# Patient Record
Sex: Male | Born: 1965 | Race: White | Hispanic: No | Marital: Married | Smoking: Never smoker
Health system: Southern US, Community
[De-identification: ages and names within clinical notes are randomized; demographics above are authoritative.]

## PROBLEM LIST (undated history)

## (undated) DIAGNOSIS — E7800 Pure hypercholesterolemia, unspecified: Secondary | ICD-10-CM

## (undated) DIAGNOSIS — E119 Type 2 diabetes mellitus without complications: Secondary | ICD-10-CM

## (undated) DIAGNOSIS — I10 Essential (primary) hypertension: Secondary | ICD-10-CM

## (undated) DIAGNOSIS — M50123 Cervical disc disorder at C6-C7 level with radiculopathy: Secondary | ICD-10-CM

## (undated) DIAGNOSIS — R29898 Other symptoms and signs involving the musculoskeletal system: Secondary | ICD-10-CM

## (undated) DIAGNOSIS — A770 Spotted fever due to Rickettsia rickettsii: Secondary | ICD-10-CM

## (undated) HISTORY — PX: CRANIOTOMY: SHX93

## (undated) HISTORY — PX: TONSILLECTOMY: SUR1361

## (undated) HISTORY — DX: Cervical disc disorder at C6-C7 level with radiculopathy: M50.123

---

## 2002-04-19 ENCOUNTER — Encounter: Payer: Self-pay | Admitting: Family Medicine

## 2002-04-19 ENCOUNTER — Ambulatory Visit (HOSPITAL_COMMUNITY): Admission: RE | Admit: 2002-04-19 | Discharge: 2002-04-19 | Payer: Self-pay | Admitting: Family Medicine

## 2003-03-27 ENCOUNTER — Ambulatory Visit (HOSPITAL_COMMUNITY): Admission: RE | Admit: 2003-03-27 | Discharge: 2003-03-27 | Payer: Self-pay | Admitting: Family Medicine

## 2003-03-27 ENCOUNTER — Encounter: Payer: Self-pay | Admitting: Family Medicine

## 2005-01-26 ENCOUNTER — Observation Stay (HOSPITAL_COMMUNITY): Admission: RE | Admit: 2005-01-26 | Discharge: 2005-01-27 | Payer: Self-pay | Admitting: Orthopedic Surgery

## 2005-01-26 ENCOUNTER — Encounter (INDEPENDENT_AMBULATORY_CARE_PROVIDER_SITE_OTHER): Payer: Self-pay | Admitting: *Deleted

## 2005-10-13 ENCOUNTER — Encounter: Admission: RE | Admit: 2005-10-13 | Discharge: 2005-10-13 | Payer: Self-pay | Admitting: Orthopedic Surgery

## 2007-07-14 ENCOUNTER — Emergency Department (HOSPITAL_COMMUNITY): Admission: EM | Admit: 2007-07-14 | Discharge: 2007-07-14 | Payer: Self-pay | Admitting: Family Medicine

## 2007-08-03 ENCOUNTER — Emergency Department (HOSPITAL_COMMUNITY): Admission: EM | Admit: 2007-08-03 | Discharge: 2007-08-03 | Payer: Self-pay | Admitting: Family Medicine

## 2011-01-01 NOTE — Op Note (Signed)
NAME:  Derrick Duncan, Derrick Duncan NO.:  [id number]   MEDICAL RECORD NO.:  [id number]          PATIENT TYPE:  AMB   LOCATION:  DAY                          FACILITY:  [facility]   PHYSICIAN:  Marlowe Kays, M.D.  DATE OF BIRTH:  04-Nov-1965   DATE OF PROCEDURE:  01/26/2005  DATE OF DISCHARGE:                                 OPERATIVE REPORT   PREOPERATIVE DIAGNOSES:  1.  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  2.  Tear peroneus longus tendon, right ankle and foot.   POSTOPERATIVE DIAGNOSES:  1.  No tarsal coalition noted.  2.  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   OPERATION:  1.  Exploration of right foot for suspected tarsal coalition using C-arm.  2.  Exploration of peroneal tendon right ankle and foot with release tendon      sheath and biopsy of chronic synovitis.   SURGEON:  Marlowe Kays, M.D.   ASSISTANTDruscilla Brownie. Cherlynn June.   ANESTHESIA:  General.   PATHOLOGY AND JUSTIFICATION FOR PROCEDURE:  He has had pain and swelling in  the anterior and lateral right ankle dating from July of 2005. He works out  regularly on the treadmill. He appeared to have a peroneal tendonitis and I  sent him for an MRI which was performed on May 03, 2004. This  demonstrated what appeared to be a longitudinal split tear in the peroneus  longus tendon just distal to the fibular tip as well as an atypical tarsal  coalition involving the cuboid and the navicular. He was originally  scheduled for surgery last November but because of family illness this had  to be postponed. Because his ankle continued to be symptomatic, it was not  felt that any new MRI was needed and he is here today for exploration of the  right foot and takedown of tarsal coalition if found and also repair of the  tear in the peroneus longus tendon. See operative description below for  additional details of the surgery.   DESCRIPTION OF PROCEDURE:   Satisfactory general anesthesia, pneumatic  tourniquet, leg was esmarched out nonsteriley and prepped from mid calf to  toes with DuraPrep, draped in a sterile field. I first used a C-arm to  localize the area of the cuboid navicular suspected coalition. I was then  able to incorporate this area along with the incision I would need for  exploration of the perineal tendon then do a curvilinear incision. I used  the C-arm repetitively to locate the cuboid navicular area. This involved  elevating up the extensor brevis muscle which was protected. Under direct  visualization, I was able to see the calcaneocuboid joint which looked  normal. I then carried it superiorly to the calcaneotalar and cuboid  navicular joints. Under direct visualization, I was able to see that there  was some fibrous tissue there but there was motion at all these joints  passively and it did not appear that there was anything in the way of  coalition that needed to be excised. The position of exposure was documented  by using  the C-arm. I then dismissed x-ray and went to the peroneus longus  tendon. The sheath itself was thickened and appeared to be somewhat abnormal  appearing. I opened it with a 15 knife blade and a large amount of fluid  came forward indicating chronic injury. This was at the level just distal to  the fibular tip as noted on the MRI. He had a lot of reactive synovitis  present which I debrided out and sent to pathology. I then explored the  tendon from well proximal to the tip of the fibula to well distal almost  down to the metatarsal area where it entered down underneath the foot. I did  find a 2 cm area of healed __________ in the tendon which would appear to  have been the area that was noted on the MRI. Mechanically it appeared to me  what had happened was that he had chronic peroneal tendonitis due to  impingement of the very thickened sheath with injury to the peroneal tendon  which had healed  itself but the mechanical impingement problem was ongoing.  Therefore after excising all the synovium, I left the very thickened tendon  sheath open, the __________ was stable but no longer had any impingement on  it. I irrigated the wound with sterile saline with sterile saline. I  reattached the lateral portion of the extensor brevis musculature from its  origin. We closed the subcutaneous tissue with interrupted 3-0 Vicryl, the  skin with a combination of 4-0 and 3-0 nylon mattress sutures. Betadine  Adaptic dry sterile dressing with a well padded short leg splint cast was  applied. The tourniquet was released. He tolerated the procedure well and  was taken to the recovery room in satisfactory condition with no known  complications.       JA/MEDQ  D:  01/26/2005  T:  01/26/2005  Job:  [id number]

## 2011-08-25 ENCOUNTER — Other Ambulatory Visit: Payer: Self-pay | Admitting: Orthopedic Surgery

## 2011-08-25 DIAGNOSIS — M25552 Pain in left hip: Secondary | ICD-10-CM

## 2011-08-28 ENCOUNTER — Ambulatory Visit
Admission: RE | Admit: 2011-08-28 | Discharge: 2011-08-28 | Disposition: A | Discharge status: Home / Self Care | Payer: Self-pay | Source: Ambulatory Visit | Attending: Orthopedic Surgery | Admitting: Orthopedic Surgery

## 2011-08-28 DIAGNOSIS — M25552 Pain in left hip: Secondary | ICD-10-CM

## 2011-09-02 ENCOUNTER — Other Ambulatory Visit: Payer: Self-pay | Admitting: Orthopedic Surgery

## 2011-09-02 DIAGNOSIS — M25552 Pain in left hip: Secondary | ICD-10-CM

## 2011-09-08 ENCOUNTER — Other Ambulatory Visit: Payer: BC Managed Care – PPO

## 2011-09-15 ENCOUNTER — Ambulatory Visit
Admission: RE | Admit: 2011-09-15 | Discharge: 2011-09-15 | Disposition: A | Discharge status: Home / Self Care | Payer: BC Managed Care – PPO | Source: Ambulatory Visit | Attending: Orthopedic Surgery | Admitting: Orthopedic Surgery

## 2011-09-15 DIAGNOSIS — M25552 Pain in left hip: Secondary | ICD-10-CM

## 2012-03-04 ENCOUNTER — Emergency Department (HOSPITAL_COMMUNITY)
Admission: EM | Admit: 2012-03-04 | Discharge: 2012-03-04 | Disposition: A | Discharge status: Home / Self Care | Payer: BC Managed Care – PPO | Attending: Emergency Medicine | Admitting: Emergency Medicine

## 2012-03-04 ENCOUNTER — Encounter (HOSPITAL_COMMUNITY): Payer: Self-pay | Admitting: Emergency Medicine

## 2012-03-04 DIAGNOSIS — T148XXA Other injury of unspecified body region, initial encounter: Secondary | ICD-10-CM

## 2012-03-04 DIAGNOSIS — Y998 Other external cause status: Secondary | ICD-10-CM | POA: Insufficient documentation

## 2012-03-04 DIAGNOSIS — L03119 Cellulitis of unspecified part of limb: Secondary | ICD-10-CM

## 2012-03-04 DIAGNOSIS — E785 Hyperlipidemia, unspecified: Secondary | ICD-10-CM | POA: Insufficient documentation

## 2012-03-04 DIAGNOSIS — I10 Essential (primary) hypertension: Secondary | ICD-10-CM | POA: Insufficient documentation

## 2012-03-04 DIAGNOSIS — E7800 Pure hypercholesterolemia, unspecified: Secondary | ICD-10-CM | POA: Insufficient documentation

## 2012-03-04 DIAGNOSIS — L02619 Cutaneous abscess of unspecified foot: Secondary | ICD-10-CM | POA: Insufficient documentation

## 2012-03-04 DIAGNOSIS — W268XXA Contact with other sharp object(s), not elsewhere classified, initial encounter: Secondary | ICD-10-CM | POA: Insufficient documentation

## 2012-03-04 DIAGNOSIS — Y9239 Other specified sports and athletic area as the place of occurrence of the external cause: Secondary | ICD-10-CM | POA: Insufficient documentation

## 2012-03-04 DIAGNOSIS — Y9301 Activity, walking, marching and hiking: Secondary | ICD-10-CM | POA: Insufficient documentation

## 2012-03-04 DIAGNOSIS — S91309A Unspecified open wound, unspecified foot, initial encounter: Secondary | ICD-10-CM | POA: Insufficient documentation

## 2012-03-04 HISTORY — DX: Pure hypercholesterolemia, unspecified: E78.00

## 2012-03-04 HISTORY — DX: Essential (primary) hypertension: I10

## 2012-03-04 MED ORDER — HYDROCODONE-ACETAMINOPHEN 5-325 MG PO TABS
2.0000 | ORAL_TABLET | Freq: Once | ORAL | Status: AC
  Administered 2012-03-04: 2 via ORAL
  Filled 2012-03-04: qty 2

## 2012-03-04 MED ORDER — AMOXICILLIN-POT CLAVULANATE 875-125 MG PO TABS
1.0000 | ORAL_TABLET | Freq: Once | ORAL | Status: AC
  Administered 2012-03-04: 1 via ORAL
  Filled 2012-03-04: qty 1

## 2012-03-04 MED ORDER — HYDROCODONE-ACETAMINOPHEN 5-325 MG PO TABS: 1.0000 | ORAL_TABLET | ORAL | Status: AC | PRN

## 2012-03-04 MED ORDER — TETANUS-DIPHTH-ACELL PERTUSSIS 5-2.5-18.5 LF-MCG/0.5 IM SUSP
0.5000 mL | Freq: Once | INTRAMUSCULAR | Status: AC
  Administered 2012-03-04: 0.5 mL via INTRAMUSCULAR
  Filled 2012-03-04: qty 0.5

## 2012-03-04 MED ORDER — AMOXICILLIN-POT CLAVULANATE 875-125 MG PO TABS: 1.0000 | ORAL_TABLET | Freq: Two times a day (BID) | ORAL | Status: AC

## 2012-03-04 NOTE — ED Provider Notes (Signed)
Medical screening examination/treatment/procedure(s) were performed by non-physician practitioner and as supervising physician I was immediately available for consultation/collaboration.   Loren Racer, MD 03/04/12 [phone]

## 2012-03-04 NOTE — ED Provider Notes (Signed)
History     CSN: [id number]  Arrival date & time 03/04/12  2105   First MD Initiated Contact with Patient 03/04/12 2123      Chief Complaint  Patient presents with  . Foot Injury   HPI  History provided by the patient and wife. Patient is a 46 year old male with history of hypertension hypercholesterolemia who presents with foot injury and pain. Patient [place] and walking barefoot in a pond. Patient stepped on something in the water which appear to be a stick or some similar-type material. This punctured into his foot. His wife who is an Char[employer] fundraiser attempted to remove this material and believes that most other was removed. Since that time patient has had continued swelling and pain to the foot area. There is slight redness. They deny having any bleeding or drainage from the area. Patient denies any other aggravating or alleviating factors. He denies any other associated symptoms. Denies any erythematous streaks, fever, chills or sweats.    Past Medical History  Diagnosis Date  . Hypertension   . Hypercholesteremia     Past Surgical History  Procedure Date  . Craniotomy     History reviewed. No pertinent family history.  History  Substance Use Topics  . Smoking status: Never Smoker   . Smokeless tobacco: Not on file  . Alcohol Use: Yes     Light Use      Review of Systems  Constitutional: Negative for fever and chills.  Musculoskeletal:       Pain and swelling to the right foot and toes  Neurological: Negative for weakness and numbness.    Allergies  Review of patient's allergies indicates no known allergies.  Home Medications   Current Outpatient Rx  Name Route Sig Dispense Refill  . LISINOPRIL 40 MG PO TABS Oral Take 40 mg by mouth daily.    Marland Kitchen SIMVASTATIN 40 MG PO TABS Oral Take 40 mg by mouth every evening.      BP 123/80  Pulse 90  Temp 98.3 F (36.8 C) (Oral)  Resp 18  SpO2 98%  Physical Exam  Nursing note and vitals  reviewed. Constitutional: He is oriented to person, place, and time. He appears well-developed and well-nourished. No distress.  HENT:  Head: Normocephalic.  Cardiovascular: Normal rate and regular rhythm.   Pulmonary/Chest: Effort normal and breath sounds normal.  Musculoskeletal:       Diffuse swelling to the anterior pad of the right foot with erythema at the base of the right third toe. There is also small puncture type wound to the skin.  Normal sensation to the toes. Normal cap refill. Normal dorsal pedal pulses. No erythematous streaks or calf tenderness.  Neurological: He is alert and oriented to person, place, and time.  Skin: Skin is warm.  Psychiatric: He has a normal mood and affect. His behavior is normal.    ED Course  Procedures        1. Cellulitis of foot   2. Puncture wound       MDM  9:45PM patient seen and evaluated. There is swelling and slight erythema especially to the base of the third toe. Will obtain x-rays to look for any retained foreign bodies. There is clinical concern for infection and will certainly plan to start patient on antibiotics per  After further consideration patient and wife do not wish to have x-rays at this time. They wish to just followup with orthopedic specialist outpatient. Will give one dose  of Augmentin here in the emergency room and prescription for the same. Patient also given prescription for Hebert S[place], [place] 03/04/12 2207

## 2012-03-04 NOTE — ED Notes (Signed)
Patient complaining of right foot injury; states that he was out working in the yard/field yesterday when he stepped on a stick/unknown substance while in pond water.  Patient's family member tried to remove most of the stick/substance from right foot; states that the redness, swelling and pain is worse today.  Last tetanus has been within the last 10 years, but not in the last 5 years.

## 2013-01-05 ENCOUNTER — Emergency Department (HOSPITAL_COMMUNITY)
Admission: EM | Admit: 2013-01-05 | Discharge: 2013-01-05 | Disposition: A | Discharge status: Home / Self Care | Payer: BC Managed Care – PPO | Attending: Emergency Medicine | Admitting: Emergency Medicine

## 2013-01-05 ENCOUNTER — Emergency Department (HOSPITAL_COMMUNITY): Payer: BC Managed Care – PPO

## 2013-01-05 ENCOUNTER — Encounter (HOSPITAL_COMMUNITY): Payer: Self-pay | Admitting: Emergency Medicine

## 2013-01-05 DIAGNOSIS — R509 Fever, unspecified: Secondary | ICD-10-CM | POA: Insufficient documentation

## 2013-01-05 DIAGNOSIS — R748 Abnormal levels of other serum enzymes: Secondary | ICD-10-CM | POA: Insufficient documentation

## 2013-01-05 DIAGNOSIS — Z79899 Other long term (current) drug therapy: Secondary | ICD-10-CM | POA: Insufficient documentation

## 2013-01-05 DIAGNOSIS — E7800 Pure hypercholesterolemia, unspecified: Secondary | ICD-10-CM | POA: Insufficient documentation

## 2013-01-05 DIAGNOSIS — R42 Dizziness and giddiness: Secondary | ICD-10-CM | POA: Insufficient documentation

## 2013-01-05 DIAGNOSIS — R51 Headache: Secondary | ICD-10-CM | POA: Insufficient documentation

## 2013-01-05 DIAGNOSIS — R112 Nausea with vomiting, unspecified: Secondary | ICD-10-CM | POA: Insufficient documentation

## 2013-01-05 DIAGNOSIS — I10 Essential (primary) hypertension: Secondary | ICD-10-CM | POA: Insufficient documentation

## 2013-01-05 LAB — PROTEIN AND GLUCOSE, CSF
Glucose, CSF: 69 mg/dL (ref 43–76)
Total  Protein, CSF: 50 mg/dL — ABNORMAL HIGH (ref 15–45)

## 2013-01-05 LAB — COMPREHENSIVE METABOLIC PANEL
AST: 239 U/L — ABNORMAL HIGH (ref 0–37)
Calcium: 9.4 mg/dL (ref 8.4–10.5)
Creatinine, Ser: 0.93 mg/dL (ref 0.50–1.35)
GFR calc non Af Amer: >90 mL/min (ref >90)
Glucose, Bld: 113 mg/dL — ABNORMAL HIGH (ref 70–99)
Potassium: 3.9 mEq/L (ref 3.5–5.1)
Sodium: 136 mEq/L (ref 135–145)
Total Bilirubin: 2 mg/dL — ABNORMAL HIGH (ref 0.3–1.2)
Total Protein: 7 g/dL (ref 6.0–8.3)

## 2013-01-05 LAB — CBC WITH DIFFERENTIAL/PLATELET
Eosinophils Absolute: 0 10*3/uL (ref 0.0–0.7)
Eosinophils Relative: 0 % (ref 0–5)
HCT: 38.6 % — ABNORMAL LOW (ref 39.0–52.0)
Lymphs Abs: 0.4 10*3/uL — ABNORMAL LOW (ref 0.7–4.0)
MCH: 31 pg (ref 26.0–34.0)
MCHC: 36 g/dL (ref 30.0–36.0)
MCV: 86.2 fL (ref 78.0–100.0)
RDW: 12.9 % (ref 11.5–15.5)

## 2013-01-05 LAB — GRAM STAIN

## 2013-01-05 LAB — CSF CELL COUNT WITH DIFFERENTIAL: WBC, CSF: 1 /mm3 (ref 0–5)

## 2013-01-05 MED ORDER — OXYCODONE-ACETAMINOPHEN 5-325 MG PO TABS
2.0000 | ORAL_TABLET | Freq: Once | ORAL | Status: AC
  Administered 2013-01-05: 2 via ORAL
  Filled 2013-01-05: qty 2

## 2013-01-05 MED ORDER — ONDANSETRON HCL 4 MG/2ML IJ SOLN
4.0000 mg | Freq: Once | INTRAMUSCULAR | Status: AC
  Administered 2013-01-05: 4 mg via INTRAVENOUS
  Filled 2013-01-05: qty 2

## 2013-01-05 MED ORDER — OXYCODONE-ACETAMINOPHEN 5-325 MG PO TABS: 1.0000 | ORAL_TABLET | Freq: Four times a day (QID) | ORAL | Status: DC | PRN

## 2013-01-05 MED ORDER — ONDANSETRON 8 MG PO TBDP: 8.0000 mg | ORAL_TABLET | Freq: Three times a day (TID) | ORAL | Status: DC | PRN

## 2013-01-05 MED ORDER — SODIUM CHLORIDE 0.9 % IV BOLUS (SEPSIS)
1000.0000 mL | Freq: Once | INTRAVENOUS | Status: AC
  Administered 2013-01-05: 1000 mL via INTRAVENOUS

## 2013-01-05 MED ORDER — MORPHINE SULFATE 4 MG/ML IJ SOLN
6.0000 mg | Freq: Once | INTRAMUSCULAR | Status: AC
  Administered 2013-01-05: 4 mg via INTRAVENOUS
  Filled 2013-01-05: qty 1

## 2013-01-05 MED ORDER — MORPHINE SULFATE 4 MG/ML IJ SOLN
6.0000 mg | Freq: Once | INTRAMUSCULAR | Status: AC
  Administered 2013-01-05: 6 mg via INTRAVENOUS
  Filled 2013-01-05: qty 2

## 2013-01-05 NOTE — ED Provider Notes (Signed)
History     CSN: [id number]  Arrival date & time 01/05/13  1039   First MD Initiated Contact with Patient 01/05/13 1058      Chief Complaint  Patient presents with  . Fever    (Consider location/radiation/quality/duration/timing/severity/associated sxs/prior treatment) HPI Comments: Patient presents with complaint of fever and headache for the past 3 days. Patient saw his primary care physician 2 days ago and started on Tamiflu which was then changed to doxycycline yesterday. Concern for a tickborne illness given patient is in the woods a lot and has removed multiple ticks on him over the past few months. Headache is generalized and does not radiate into the neck. It is worse with position but not with movement. It is associated with photophobia. Patient has been having difficulty keeping down his medications do to vomiting. She denies chest pain, cough, shortness of breath, abdominal pain, diarrhea. Fever has been as high as 102F. Motrin brings it back down to normal temporarily. Onset of symptoms gradual. Course is persistent.   Patient is a 47 y.o. male presenting with fever. The history is provided by the patient and the spouse.  Fever Associated symptoms: headaches, nausea and vomiting   Associated symptoms: no chest pain, no confusion, no congestion, no cough, no diarrhea, no dysuria, no myalgias, no rash, no rhinorrhea and no sore throat     Past Medical History  Diagnosis Date  . Hypertension   . Hypercholesteremia     Past Surgical History  Procedure Laterality Date  . Craniotomy      History reviewed. No pertinent family history.  History  Substance Use Topics  . Smoking status: Never Smoker   . Smokeless tobacco: Not on file  . Alcohol Use: Yes     Comment: Light Use      Review of Systems  Constitutional: Positive for fever.  HENT: Negative for congestion, sore throat, rhinorrhea, neck pain, neck stiffness, dental problem and sinus pressure.   Eyes:  Negative for photophobia, discharge, redness and visual disturbance.  Respiratory: Negative for cough and shortness of breath.   Cardiovascular: Negative for chest pain.  Gastrointestinal: Positive for nausea and vomiting. Negative for abdominal pain and diarrhea.  Genitourinary: Negative for dysuria.  Musculoskeletal: Negative for myalgias and gait problem.  Skin: Negative for rash.  Neurological: Positive for dizziness and headaches. Negative for syncope, speech difficulty, weakness and numbness.  Psychiatric/Behavioral: Negative for confusion.    Allergies  Review of patient's allergies indicates no known allergies.  Home Medications   Current Outpatient Rx  Name  Route  Sig  Dispense  Refill  . doxycycline (DORYX) 100 MG DR capsule   Oral   Take 100 mg by mouth 2 (two) times daily.         Marland Kitchen HYDROcodone-acetaminophen (NORCO/VICODIN) 5-325 MG per tablet   Oral   Take 1-2 tablets by mouth every 6 (six) hours as needed for pain.         Marland Kitchen ibuprofen (ADVIL,MOTRIN) 600 MG tablet   Oral   Take 600 mg by mouth every 6 (six) hours as needed for fever.         Marland Kitchen lisinopril (PRINIVIL,ZESTRIL) 40 MG tablet   Oral   Take 40 mg by mouth daily.         . Multiple Vitamin (MULTIVITAMIN WITH MINERALS) TABS   Oral   Take 1 tablet by mouth daily.         . simvastatin (ZOCOR) 40 MG tablet   Oral  Take 40 mg by mouth every morning.            BP 121/79  Pulse 94  Temp(Src) 98.5 F (36.9 C) (Oral)  Resp 20  SpO2 99%  Physical Exam  Nursing note and vitals reviewed. Constitutional: He is oriented to person, place, and time. He appears well-developed and well-nourished.  HENT:  Head: Normocephalic and atraumatic.  Right Ear: Tympanic membrane, external ear and ear canal normal.  Left Ear: Tympanic membrane, external ear and ear canal normal.  Nose: Nose normal.  Mouth/Throat: Uvula is midline, oropharynx is clear and moist and mucous membranes are normal.  Eyes:  Conjunctivae, EOM and lids are normal. Pupils are equal, round, and reactive to light. Right eye exhibits no discharge. Left eye exhibits no discharge.  Neck: Normal range of motion. Neck supple.  No meningeal signs  Cardiovascular: Normal rate, regular rhythm and normal heart sounds.   Pulmonary/Chest: Effort normal and breath sounds normal.  Abdominal: Soft. There is no tenderness.  Musculoskeletal: Normal range of motion.       Cervical back: He exhibits normal range of motion, no tenderness and no bony tenderness.  Neurological: He is alert and oriented to person, place, and time. He has normal strength and normal reflexes. No cranial nerve deficit or sensory deficit. He exhibits normal muscle tone. He displays a negative Romberg sign. Coordination and gait normal. GCS eye subscore is 4. GCS verbal subscore is 5. GCS motor subscore is 6.  Skin: Skin is warm and dry.  Psychiatric: He has a normal mood and affect.    ED Course  LUMBAR PUNCTURE Date/Time: 01/05/2013 2:30 PM Performed by: Renne Crigler Authorized by: Renne Crigler Consent: Verbal consent obtained. written consent obtained. Risks and benefits: risks, benefits and alternatives were discussed Consent given by: patient Patient understanding: patient states understanding of the procedure being performed Patient consent: the patient's understanding of the procedure matches consent given Procedure consent: procedure consent matches procedure scheduled Relevant documents: relevant documents present and verified Test results: test results available and properly labeled Site marked: the operative site was marked Patient identity confirmed: verbally with patient Time out: Immediately prior to procedure a "time out" was called to verify the correct patient, procedure, equipment, support staff and site/side marked as required. Indications: evaluation for infection Anesthesia: local infiltration Local anesthetic: lidocaine 2%  without epinephrine Anesthetic total: 10 ml Patient sedated: no Preparation: Patient was prepped and draped in the usual sterile fashion. Lumbar space: L3-L4 interspace Patient's position: left lateral decubitus Needle gauge: 20 Needle type: spinal needle - Quincke tip Number of attempts: 1 Fluid appearance: blood-tinged then clearing Tubes of fluid: 4 Total volume: 4 ml Post-procedure: site cleaned and adhesive bandage applied Patient tolerance: Patient tolerated the procedure well with no immediate complications. Comments: Tech Justin at bedside to assist   (including critical care time)  Labs Reviewed  CBC WITH DIFFERENTIAL - Abnormal; Notable for the following:    WBC 2.7 (*)    HCT 38.6 (*)    Platelets 84 (*)    Lymphs Abs 0.4 (*)    All other components within normal limits  COMPREHENSIVE METABOLIC PANEL - Abnormal; Notable for the following:    Glucose, Bld 113 (*)    Albumin 3.4 (*)    AST 239 (*)    ALT 279 (*)    Alkaline Phosphatase 212 (*)    Total Bilirubin 2.0 (*)    All other components within normal limits  CSF CELL COUNT WITH  DIFFERENTIAL - Abnormal; Notable for the following:    Color, CSF PINK (*)    Appearance, CSF HAZY (*)    RBC Count, CSF 805 (*)    All other components within normal limits  PROTEIN AND GLUCOSE, CSF - Abnormal; Notable for the following:    Total  Protein, CSF 50 (*)    All other components within normal limits  GRAM STAIN  CSF CULTURE  CSF CELL COUNT WITH DIFFERENTIAL  URINALYSIS, ROUTINE W REFLEX MICROSCOPIC  ROCKY MTN SPOTTED FVR AB, IGG-BLOOD  ROCKY MTN SPOTTED FVR AB, IGM-BLOOD   Ct Head Wo Contrast  01/05/2013   *RADIOLOGY REPORT*  Clinical Data: Headache, fever  CT HEAD WITHOUT CONTRAST  Technique:  Contiguous axial images were obtained from the base of the skull through the vertex without contrast.  Comparison: None.  Findings: No acute intracranial hemorrhage, acute infarction, mass lesion, mass effect, midline shift  or hydrocephalus.  Gray-white differentiation is preserved throughout.  No acute soft tissue abnormality.  The globes and orbits are symmetric and unremarkable bilaterally.  Normal aeration of the mastoid air cells and paranasal sinuses.  No acute calvarial abnormality.  IMPRESSION: Negative noncontrasted CT scan of the head.   Original Report Authenticated By: Malachy Moan, M.D.     1. Fever   2. Headache   3. Elevated liver enzymes     11:26 AM Patient seen and examined. Work-up initiated. Medications ordered.   Vital signs reviewed and are as follows: Filed Vitals:   01/05/13 1052  BP: 121/79  Pulse: 94  Temp: 98.5 F (36.9 C)  Resp: 20   Patient d/w and seen by Dr. Bernette Mayers.   CT head reviewed by myself and is negative.   Patient agrees to go forth with LP to evaluate for meningitis.   2:20 PM LP successful, performed per procedure note. Was traumatic but cleared. Additional pain medication ordered. Awaiting results.   3:22 PM Recheck, patient continues to lie flat. He is comfortable. Awaiting CSF results.   4:37 PM Discussed all results with patient and his wife.   Plan: switch pain medication from vicodin to percocet as vicodin is not working.  Follow-up with PCP next week for recheck. F/u on RSMF titre. Recheck liver enzymes after acute phase of illness is complete.  Zofran written for nausea, he is to continue doxycycline to cover for tick-borne illness.  Post LP instructions given: lie flat, drink caffeine, hydrate well.   Return with worsening HA, persistent vomiting, new or changing symptoms.   Patient and wife verbalize understanding and agree with plan.    MDM  Fever of unknown origin. Vital etiology, tick-borne illness is possible. Concern for meningitis here given severe HA and fever history. LP was performed and CSF does not indicate infection. WBC, liver enzymes will need to be followed as outpatient. Patient appears non-toxic and has reliable PCP f/u.  Covered for tick-borne illness. Return instructions given.         Renne Crigler, PA-C 01/05/13 [phone]

## 2013-01-05 NOTE — ED Notes (Signed)
Csf sample colected by Josh-PA

## 2013-01-05 NOTE — ED Notes (Signed)
Assumed patient care from leaving RN. Introduced myself to patient and updated on his care.

## 2013-01-05 NOTE — ED Notes (Signed)
Pt sts HA with fever onset last Tuesday!  Pt sts had "30" ticks removed since march.  Pt caox4, nad. C/o HA, N/V, light headed dizzy.  Last meal was Tuesday.

## 2013-01-08 LAB — CSF CULTURE W GRAM STAIN: Culture: NO GROWTH

## 2013-01-08 NOTE — ED Provider Notes (Signed)
Medical screening examination/treatment/procedure(s) were conducted as a shared visit with non-physician practitioner(s) and myself.  I personally evaluated the patient during the encounter   Pt with fever, headache, general malaise for several days. Many tick bites, started on Doxy by PCP but vomiting and unable to keep it down. LP done by [place], [facility] reviewed.   Charles B. Bernette Mayers, MD 01/08/13 1301

## 2013-01-09 LAB — ROCKY MTN SPOTTED FVR AB, IGM-BLOOD: RMSF IgM: 0.27 IV (ref 0.00–0.89)

## 2013-01-10 ENCOUNTER — Telehealth (HOSPITAL_COMMUNITY): Payer: Self-pay | Admitting: Emergency Medicine

## 2013-01-10 NOTE — ED Notes (Signed)
Pt calling for [facility] Spotted Fever results.  Pt gave permission to speak to his wife about results.  Pt's wife informed RMSF IgG & IgM results were negative and CSF cx was final w/no growth after 3 days

## 2015-02-09 ENCOUNTER — Encounter (HOSPITAL_COMMUNITY): Payer: Self-pay | Admitting: Emergency Medicine

## 2015-02-09 ENCOUNTER — Emergency Department (HOSPITAL_COMMUNITY)
Admission: EM | Admit: 2015-02-09 | Discharge: 2015-02-09 | Disposition: A | Discharge status: Home / Self Care | Payer: BC Managed Care – PPO | Attending: Emergency Medicine | Admitting: Emergency Medicine

## 2015-02-09 DIAGNOSIS — Z79899 Other long term (current) drug therapy: Secondary | ICD-10-CM | POA: Insufficient documentation

## 2015-02-09 DIAGNOSIS — I10 Essential (primary) hypertension: Secondary | ICD-10-CM | POA: Insufficient documentation

## 2015-02-09 DIAGNOSIS — R739 Hyperglycemia, unspecified: Secondary | ICD-10-CM | POA: Diagnosis present

## 2015-02-09 DIAGNOSIS — Z8619 Personal history of other infectious and parasitic diseases: Secondary | ICD-10-CM | POA: Insufficient documentation

## 2015-02-09 HISTORY — DX: Spotted fever due to Rickettsia rickettsii: A77.0

## 2015-02-09 LAB — URINALYSIS, ROUTINE W REFLEX MICROSCOPIC
BILIRUBIN URINE: NEGATIVE
HGB URINE DIPSTICK: NEGATIVE
Leukocytes, UA: NEGATIVE
Nitrite: NEGATIVE
PH: 5.5 (ref 5.0–8.0)
PROTEIN: NEGATIVE mg/dL
Specific Gravity, Urine: 1.015 (ref 1.005–1.030)
Urobilinogen, UA: 0.2 mg/dL (ref 0.0–1.0)

## 2015-02-09 LAB — CBC WITH DIFFERENTIAL/PLATELET
BASOS ABS: 0 10*3/uL (ref 0.0–0.1)
Basophils Relative: 0 % (ref 0–1)
Eosinophils Absolute: 0.1 10*3/uL (ref 0.0–0.7)
Eosinophils Relative: 1 % (ref 0–5)
HCT: 42.7 % (ref 39.0–52.0)
HEMOGLOBIN: 15.3 g/dL (ref 13.0–17.0)
Lymphocytes Relative: 29 % (ref 12–46)
Lymphs Abs: 2.5 10*3/uL (ref 0.7–4.0)
MCH: 31.3 pg (ref 26.0–34.0)
MCHC: 35.8 g/dL (ref 30.0–36.0)
MCV: 87.3 fL (ref 78.0–100.0)
MONO ABS: 0.8 10*3/uL (ref 0.1–1.0)
Monocytes Relative: 9 % (ref 3–12)
Neutro Abs: 5.3 10*3/uL (ref 1.7–7.7)
Neutrophils Relative %: 61 % (ref 43–77)
Platelets: 268 10*3/uL (ref 150–400)
RBC: 4.89 MIL/uL (ref 4.22–5.81)
RDW: 12.3 % (ref 11.5–15.5)
WBC: 8.7 10*3/uL (ref 4.0–10.5)

## 2015-02-09 LAB — URINE MICROSCOPIC-ADD ON

## 2015-02-09 LAB — COMPREHENSIVE METABOLIC PANEL
ALT: 39 U/L (ref 17–63)
ANION GAP: 11 (ref 5–15)
AST: 27 U/L (ref 15–41)
Albumin: 4.7 g/dL (ref 3.5–5.0)
Alkaline Phosphatase: 120 U/L (ref 38–126)
BILIRUBIN TOTAL: 1.4 mg/dL — AB (ref 0.3–1.2)
BUN: 29 mg/dL — ABNORMAL HIGH (ref 6–20)
CALCIUM: 9.8 mg/dL (ref 8.9–10.3)
CO2: 26 mmol/L (ref 22–32)
Chloride: 89 mmol/L — ABNORMAL LOW (ref 101–111)
Creatinine, Ser: 1.22 mg/dL (ref 0.61–1.24)
GFR calc non Af Amer: >60 mL/min (ref >60)
Glucose, Bld: 525 mg/dL — ABNORMAL HIGH (ref 65–99)
Potassium: 4.5 mmol/L (ref 3.5–5.1)
Sodium: 126 mmol/L — ABNORMAL LOW (ref 135–145)
TOTAL PROTEIN: 7.8 g/dL (ref 6.5–8.1)

## 2015-02-09 LAB — CBG MONITORING, ED
Glucose-Capillary: 550 mg/dL — ABNORMAL HIGH (ref 65–99)
Glucose-Capillary: 294 mg/dL — ABNORMAL HIGH (ref 65–99)

## 2015-02-09 MED ORDER — IBUPROFEN 800 MG PO TABS
ORAL_TABLET | ORAL | Status: AC
  Filled 2015-02-09: qty 1

## 2015-02-09 MED ORDER — SODIUM CHLORIDE 0.9 % IV BOLUS (SEPSIS)
1000.0000 mL | Freq: Once | INTRAVENOUS | Status: AC
  Administered 2015-02-09: 1000 mL via INTRAVENOUS

## 2015-02-09 MED ORDER — IBUPROFEN 800 MG PO TABS
800.0000 mg | ORAL_TABLET | Freq: Once | ORAL | Status: AC
  Administered 2015-02-09: 800 mg via ORAL

## 2015-02-09 MED ORDER — METFORMIN HCL 500 MG PO TABS
500.0000 mg | ORAL_TABLET | Freq: Once | ORAL | Status: AC
  Administered 2015-02-09: 500 mg via ORAL
  Filled 2015-02-09: qty 1

## 2015-02-09 MED ORDER — METFORMIN HCL 1000 MG PO TABS: 500.0000 mg | ORAL_TABLET | Freq: Two times a day (BID) | ORAL | Status: DC

## 2015-02-09 NOTE — ED Notes (Signed)
Patient reports increase in thirst and urination, blurred vision, cramping in hands, and fatigue. Per wife patient's blood sugar checked-345. Patient does not have hx of diabetes. Blood sugar 550 in triage. Per wife patient started vomiting last night. No active vomiting noted at this time.

## 2015-02-09 NOTE — ED Notes (Signed)
MD at bedside. 

## 2015-02-09 NOTE — ED Provider Notes (Signed)
TIME SEEN: 2:20 PM  CHIEF COMPLAINT: Hyperglycemia  HPI: Pt is a 49 y.o. male with history of hypertension, hyperlipidemia who presents emergency department with several days of polydipsia, polyuria, headaches, blurry vision, numbness and tingling in his fingers and toes, fatigue. Wife reports she checked his blood sugar at home and was 345. In the ED is 550. States he did have nausea vomiting last night but none currently. No abdominal pain. No history of previous diabetes. Reports his father does have type 2 diabetes.  ROS: See HPI Constitutional: no fever  Eyes: no drainage  ENT: no runny nose   Cardiovascular:  no chest pain  Resp: no SOB  GI: no vomiting GU: no dysuria Integumentary: no rash  Allergy: no hives  Musculoskeletal: no leg swelling  Neurological: no slurred speech ROS otherwise negative  PAST MEDICAL HISTORY/PAST SURGICAL HISTORY:  Past Medical History  Diagnosis Date  . Hypertension   . Hypercholesteremia   . [place] spotted fever     MEDICATIONS:  Prior to Admission medications   Medication Sig Start Date End Date Taking? Authorizing Provider  lisinopril (PRINIVIL,ZESTRIL) 40 MG tablet Take 40 mg by mouth daily.   Yes Historical Provider, MD  simvastatin (ZOCOR) 40 MG tablet Take 40 mg by mouth every morning.    Yes Historical Provider, MD  ondansetron (ZOFRAN ODT) 8 MG disintegrating tablet Take 1 tablet (8 mg total) by mouth every 8 (eight) hours as needed for nausea. Patient not taking: Reported on 02/09/2015 01/05/13   Renne Crigler, PA-C  oxyCODONE-acetaminophen (PERCOCET/ROXICET) 5-325 MG per tablet Take 1-2 tablets by mouth every 6 (six) hours as needed for pain. Patient not taking: Reported on 02/09/2015 01/05/13   Renne Crigler, PA-C    ALLERGIES:  No Known Allergies  SOCIAL HISTORY:  History  Substance Use Topics  . Smoking status: Never Smoker   . Smokeless tobacco: Current User    Types: Chew  . Alcohol Use: Yes     Comment: Light Use     FAMILY HISTORY: Family History  Problem Relation Age of Onset  . Diabetes Father     EXAM: BP 112/74 mmHg  Pulse 86  Temp(Src) 98 F (36.7 C) (Oral)  Resp 16  Ht 6' (1.829 m)  Wt 244 lb (110.678 kg)  BMI 33.09 kg/m2  SpO2 97% CONSTITUTIONAL: Alert and oriented and responds appropriately to questions. Well-appearing; well-nourished, afebrile, pleasant, in no distress HEAD: Normocephalic EYES: Conjunctivae clear, PERRL ENT: normal nose; no rhinorrhea; slightly dry mucous membranes; pharynx without lesions noted NECK: Supple, no meningismus, no LAD  CARD: RRR; S1 and S2 appreciated; no murmurs, no clicks, no rubs, no gallops RESP: Normal chest excursion without splinting or tachypnea; breath sounds clear and equal bilaterally; no wheezes, no rhonchi, no rales, no hypoxia or respiratory distress, speaking full sentences ABD/GI: Normal bowel sounds; non-distended; soft, non-tender, no rebound, no guarding, no peritoneal signs BACK:  The back appears normal and is non-tender to palpation, there is no CVA tenderness EXT: Normal ROM in all joints; non-tender to palpation; no edema; normal capillary refill; no cyanosis, no calf tenderness or swelling    SKIN: Normal color for age and race; warm NEURO: Moves all extremities equally, sensation to light touch intact diffusely, cranial nerves II through XII intact PSYCH: The patient's mood and manner are appropriate. Grooming and personal hygiene are appropriate.  MEDICAL DECISION MAKING: Patient here with hyperglycemia. Hemodynamically stable. Exam benign. We'll obtain labs, urine. Will treat with IV hydration.  ED PROGRESS:  Patient's labs show hyponatremia with a sodium of 126 likely from his hyperglycemia. Corrected sodium is 133. Bicarbonate is 26, anion gap 11. Urine does show trace ketones and glucosuria. Patient is not in DKA. We'll recheck CBG after 2 L of IV fluids.   Patient's blood glucose has improved 294 after 2 L of IV  fluids. We'll start him on metformin 500 mg. Discussed with him at length increasing exercise and changing his diet. Have also discussed monitoring his blood glucose at home in the morning and after meals and keeping a log of this. Have discussed close follow-up with his primary care provider and return precautions. Patient and wife verbalize understanding and are comfortable with plan.     Layla Maw Juventino Pavone, DO 02/09/15 1606

## 2015-02-09 NOTE — Discharge Instructions (Signed)
Hyperglycemia °Hyperglycemia occurs when the glucose (sugar) in your blood is too high. Hyperglycemia can happen for many reasons, but it most often happens to people who do not know they have diabetes or are not managing their diabetes properly.  °CAUSES  °Whether you have diabetes or not, there are other causes of hyperglycemia. Hyperglycemia can occur when you have diabetes, but it can also occur in other situations that you might not be as aware of, such as: °Diabetes °· If you have diabetes and are having problems controlling your blood glucose, hyperglycemia could occur because of some of the following reasons: °¨ Not following your meal plan. °¨ Not taking your diabetes medications or not taking it properly. °¨ Exercising less or doing less activity than you normally do. °¨ Being sick. °Pre-diabetes °· This cannot be ignored. Before people develop Type 2 diabetes, they almost always have "pre-diabetes." This is when your blood glucose levels are higher than normal, but not yet high enough to be diagnosed as diabetes. Research has shown that some long-term damage to the body, especially the heart and circulatory system, may already be occurring during pre-diabetes. If you take action to manage your blood glucose when you have pre-diabetes, you may delay or prevent Type 2 diabetes from developing. °Stress °· If you have diabetes, you may be "diet" controlled or on oral medications or insulin to control your diabetes. However, you may find that your blood glucose is higher than usual in the hospital whether you have diabetes or not. This is often referred to as "stress hyperglycemia." Stress can elevate your blood glucose. This happens because of hormones put out by the body during times of stress. If stress has been the cause of your high blood glucose, it can be followed regularly by your caregiver. That way he/she can make sure your hyperglycemia does not continue to get worse or progress to  diabetes. °Steroids °· Steroids are medications that act on the infection fighting system (immune system) to block inflammation or infection. One side effect can be a rise in blood glucose. Most people can produce enough extra insulin to allow for this rise, but for those who cannot, steroids make blood glucose levels go even higher. It is not unusual for steroid treatments to "uncover" diabetes that is developing. It is not always possible to determine if the hyperglycemia will go away after the steroids are stopped. A special blood test called an A1c is sometimes done to determine if your blood glucose was elevated before the steroids were started. °SYMPTOMS °· Thirsty. °· Frequent urination. °· Dry mouth. °· Blurred vision. °· Tired or fatigue. °· Weakness. °· Sleepy. °· Tingling in feet or leg. °DIAGNOSIS  °Diagnosis is made by monitoring blood glucose in one or all of the following ways: °· A1c test. This is a chemical found in your blood. °· Fingerstick blood glucose monitoring. °· Laboratory results. °TREATMENT  °First, knowing the cause of the hyperglycemia is important before the hyperglycemia can be treated. Treatment may include, but is not be limited to: °· Education. °· Change or adjustment in medications. °· Change or adjustment in meal plan. °· Treatment for an illness, infection, etc. °· More frequent blood glucose monitoring. °· Change in exercise plan. °· Decreasing or stopping steroids. °· Lifestyle changes. °HOME CARE INSTRUCTIONS  °· Test your blood glucose as directed. °· Exercise regularly. Your caregiver will give you instructions about exercise. Pre-diabetes or diabetes which comes on with stress is helped by exercising. °· Eat wholesome,   balanced meals. Eat often and at regular, fixed times. Your caregiver or nutritionist will give you a meal plan to guide your sugar intake. °· Being at an ideal weight is important. If needed, losing as little as 10 to 15 pounds may help improve blood  glucose levels. °SEEK MEDICAL CARE IF:  °· You have questions about medicine, activity, or diet. °· You continue to have symptoms (problems such as increased thirst, urination, or weight gain). °SEEK IMMEDIATE MEDICAL CARE IF:  °· You are vomiting or have diarrhea. °· Your breath smells fruity. °· You are breathing faster or slower. °· You are very sleepy or incoherent. °· You have numbness, tingling, or pain in your feet or hands. °· You have chest pain. °· Your symptoms get worse even though you have been following your caregiver's orders. °· If you have any other questions or concerns. °Document Released: 01/26/2001 Document Revised: 10/25/2011 Document Reviewed: 11/29/2011 °ExitCare® Patient Information ©2015 [employer]. This information is not intended to replace advice given to you by your health care provider. Make sure you discuss any questions you have with your health care provider. ° °Blood Glucose Monitoring °Monitoring your blood glucose (also know as blood sugar) helps you to manage your diabetes. It also helps you and your health care provider monitor your diabetes and determine how well your treatment plan is working. °WHY SHOULD YOU MONITOR YOUR BLOOD GLUCOSE? °· It can help you understand how food, exercise, and medicine affect your blood glucose. °· It allows you to know what your blood glucose is at any given moment. You can quickly tell if you are having low blood glucose (hypoglycemia) or high blood glucose (hyperglycemia). °· It can help you and your health care provider know how to adjust your medicines. °· It can help you understand how to manage an illness or adjust medicine for exercise. °WHEN SHOULD YOU TEST? °Your health care provider will help you decide how often you should check your blood glucose. This may depend on the type of diabetes you have, your diabetes control, or the types of medicines you are taking. Be sure to write down all of your blood glucose readings so that this  information can be reviewed with your health care provider. See below for examples of testing times that your health care provider may suggest. °Type 1 Diabetes °· Test 4 times a day if you are in good control, using an insulin pump, or perform multiple daily injections. °· If your diabetes is not well controlled or if you are sick, you may need to monitor more often. °· It is a good idea to also monitor: °¨ Before and after exercise. °¨ Between meals and 2 hours after a meal. °¨ Occasionally between 2:00 a.m. and 3:00 a.m. °Type 2 Diabetes °· It can vary with each person, but generally, if you are on insulin, test 4 times a day. °· If you take medicines by mouth (orally), test 2 times a day. °· If you are on a controlled diet, test once a day. °· If your diabetes is not well controlled or if you are sick, you may need to monitor more often. °HOW TO MONITOR YOUR BLOOD GLUCOSE °Supplies Needed °· Blood glucose meter. °· Test strips for your meter. Each meter has its own strips. You must use the strips that go with your own meter. °· A pricking needle (lancet). °· A device that holds the lancet (lancing device). °· A journal or log book to write down your   results. °Procedure °· Wash your hands with soap and water. Alcohol is not preferred. °· Prick the side of your finger (not the tip) with the lancet. °· Gently milk the finger until a small drop of blood appears. °· Follow the instructions that come with your meter for inserting the test strip, applying blood to the strip, and using your blood glucose meter. °Other Areas to Get Blood for Testing °Some meters allow you to use other areas of your body (other than your finger) to test your blood. These areas are called alternative sites. The most common alternative sites are: °· The forearm. °· The thigh. °· The back area of the lower leg. °· The palm of the hand. °The blood flow in these areas is slower. Therefore, the blood glucose values you get may be delayed, and  the numbers are different from what you would get from your fingers. Do not use alternative sites if you think you are having hypoglycemia. Your reading will not be accurate. Always use a finger if you are having hypoglycemia. Also, if you cannot feel your lows (hypoglycemia unawareness), always use your fingers for your blood glucose checks. °ADDITIONAL TIPS FOR GLUCOSE MONITORING °· Do not reuse lancets. °· Always carry your supplies with you. °· All blood glucose meters have a 24-hour "hotline" number to call if you have questions or need help. °· Adjust (calibrate) your blood glucose meter with a control solution after finishing a few boxes of strips. °BLOOD GLUCOSE RECORD KEEPING °It is a good idea to keep a daily record or log of your blood glucose readings. Most glucose meters, if not all, keep your glucose records stored in the meter. Some meters come with the ability to download your records to your home computer. Keeping a record of your blood glucose readings is especially helpful if you are wanting to look for patterns. Make notes to go along with the blood glucose readings because you might forget what happened at that exact time. Keeping good records helps you and your health care provider to work together to achieve good diabetes management.  °Document Released: 08/05/2003 Document Revised: 12/17/2013 Document Reviewed: 12/25/2012 °ExitCare® Patient Information ©2015 [employer]. This information is not intended to replace advice given to you by your health care provider. Make sure you discuss any questions you have with your health care provider. ° °

## 2015-02-18 ENCOUNTER — Ambulatory Visit: Payer: BC Managed Care – PPO | Admitting: *Deleted

## 2015-02-20 ENCOUNTER — Encounter: Payer: Self-pay | Admitting: *Deleted

## 2015-02-20 ENCOUNTER — Encounter: Payer: BC Managed Care – PPO | Attending: Pulmonary Disease | Admitting: *Deleted

## 2015-02-20 VITALS — BP 100/62 | Ht 72.0 in | Wt 251.1 lb

## 2015-02-20 DIAGNOSIS — E119 Type 2 diabetes mellitus without complications: Secondary | ICD-10-CM | POA: Diagnosis not present

## 2015-02-20 NOTE — Progress Notes (Signed)
Diabetes Self-Management Education  Visit Type: First/Initial  Appt. Start Time: 0905 Appt. End Time: 1015  02/20/2015  Mr. Derrick Duncan, identified by name and date of birth, is a 49 y.o. male with a diagnosis of Diabetes: Type 2.    ASSESSMENT Blood pressure 100/62, height 6' (1.829 m), weight 251 lb 1.6 oz (113.898 kg). Body mass index is 34.05 kg/(m^2).  Initial Visit Information: Are you currently following a meal plan?: Yes What type of meal plan do you follow?: lean meats, more vegetables, less white foods Are you taking your medications as prescribed?: Yes Are you checking your feet?: No How often do you need to have someone help you when you read instructions, pamphlets, or other written materials from your doctor or pharmacy?: 1 - Never What is the last grade level you completed in school?: Masters  Psychosocial: Patient Belief/Attitude about Diabetes: Motivated to manage diabetes ("it is discouraging") Self-care barriers: None Self-management support: Family, Friends Patient Concerns: Nutrition/Meal planning, Medication, Healthy Lifestyle, Glycemic Control, Weight Control Special Needs: None Preferred Learning Style: Auditory Learning Readiness: Change in progress  Complications:  Last HgB A1C per patient/outside source: 12.2 % (02/18/15 as reported by pt) How often do you check your blood sugar?: 1 time/day Fasting Blood glucose range (mg/dL): [phone], [phone] (Pt reports FBG's 171-180's mg/dL) Have you had a dilated eye exam in the past 12 months?: No Have you had a dental exam in the past 12 months?: Yes  Diet Intake: Breakfast: [place] sausage, bacon, ham, eggs, milk Snack (morning): nuts or cheese Lunch: chicken, cabbage, brocolli Snack (afternoon): nuts or cheese Dinner: meat and vegetables Beverage(s): water, unsweetened tea with Splenda, 1 diet soda  Exercise: Exercise: ADL's  Individualized Plan for Diabetes Self-Management Training:  Learning  Objective:  Patient will have a greater understanding of diabetes self-management.  Education Topics Reviewed with Patient Today: Definition of diabetes, type 1 and 2, and the diagnosis of diabetes, Factors that contribute to the development of diabetes, Explored patient's options for treatment of their diabetes Role of diet in the treatment of diabetes and the relationship between the three main macronutrients and blood glucose level, Food label reading, portion sizes and measuring food. Role of exercise on diabetes management, blood pressure control and cardiac health. Reviewed patients medication for diabetes, action, purpose, timing of dose and side effects. Purpose and frequency of SMBG., Identified appropriate SMBG and/or A1C goals. Relationship between chronic complications and blood glucose control, Retinopathy and reason for yearly dilated eye exams Identified and addressed patients feelings and concerns about diabetes  PATIENTS GOALS/Plan (Developed by the patient): Improve blood sugars Decrease medications Lose weight Lead a healthier lifestyle  Plan:   Patient Instructions  Check blood sugars 1 x day before breakfast or 2 hrs after supper every day Exercise: Begin walking for 15  minutes  3 days a week and gradually increase to 30 minutes 5 x week Eat 3 meals day,  1-2  snacks a day Space meals 4-6 hours apart Make a eye doctor appointment when blood sugars are stable Bring blood sugar records to class  Expected Outcomes:  Demonstrated interest in learning. Expect positive outcomes  Education material provided:  General Meal Planning Guidelines  If problems or questions, patient to contact team via:  Sharion SettlerSheila Shotwell, RN, CCM, CDE [phone]  Future DSME appointment:  March 24, 2015 for Class 1

## 2015-02-20 NOTE — Patient Instructions (Addendum)
Check blood sugars 1 x day before breakfast and 2 hrs after supper every day Exercise: Begin walking for 15  minutes  3 days a week and gradually increase to 30 minutes 5 x week Eat 3 meals day,  1-2  snacks a day Space meals 4-6 hours apart Make a eye doctor appointment when blood sugars are stable Bring blood sugar records to class

## 2015-03-24 ENCOUNTER — Encounter: Payer: BC Managed Care – PPO | Attending: Pulmonary Disease | Admitting: Dietician

## 2015-03-24 VITALS — Ht 72.0 in | Wt 258.3 lb

## 2015-03-24 DIAGNOSIS — E119 Type 2 diabetes mellitus without complications: Secondary | ICD-10-CM | POA: Insufficient documentation

## 2015-03-24 NOTE — Progress Notes (Signed)

## 2015-03-31 ENCOUNTER — Encounter: Payer: Self-pay | Admitting: Dietician

## 2015-03-31 ENCOUNTER — Encounter: Payer: BC Managed Care – PPO | Admitting: Dietician

## 2015-03-31 VITALS — Wt 249.5 lb

## 2015-03-31 DIAGNOSIS — E119 Type 2 diabetes mellitus without complications: Secondary | ICD-10-CM | POA: Diagnosis present

## 2015-03-31 NOTE — Progress Notes (Signed)

## 2015-04-07 ENCOUNTER — Encounter: Payer: BC Managed Care – PPO | Admitting: Dietician

## 2015-04-07 VITALS — BP 108/80 | Ht 72.0 in | Wt 251.0 lb

## 2015-04-07 DIAGNOSIS — E119 Type 2 diabetes mellitus without complications: Secondary | ICD-10-CM

## 2015-04-07 NOTE — Progress Notes (Signed)

## 2015-04-18 ENCOUNTER — Encounter: Payer: Self-pay | Admitting: *Deleted

## 2015-11-18 ENCOUNTER — Emergency Department (HOSPITAL_COMMUNITY)
Admission: EM | Admit: 2015-11-18 | Discharge: 2015-11-18 | Disposition: A | Discharge status: Home / Self Care | Payer: BC Managed Care – PPO | Attending: Emergency Medicine | Admitting: Emergency Medicine

## 2015-11-18 ENCOUNTER — Encounter (HOSPITAL_COMMUNITY): Payer: Self-pay

## 2015-11-18 ENCOUNTER — Other Ambulatory Visit: Payer: Self-pay

## 2015-11-18 ENCOUNTER — Emergency Department (HOSPITAL_COMMUNITY): Payer: BC Managed Care – PPO

## 2015-11-18 DIAGNOSIS — R0789 Other chest pain: Secondary | ICD-10-CM | POA: Diagnosis not present

## 2015-11-18 DIAGNOSIS — E7800 Pure hypercholesterolemia, unspecified: Secondary | ICD-10-CM | POA: Insufficient documentation

## 2015-11-18 DIAGNOSIS — E785 Hyperlipidemia, unspecified: Secondary | ICD-10-CM | POA: Insufficient documentation

## 2015-11-18 DIAGNOSIS — E119 Type 2 diabetes mellitus without complications: Secondary | ICD-10-CM | POA: Insufficient documentation

## 2015-11-18 DIAGNOSIS — Z79899 Other long term (current) drug therapy: Secondary | ICD-10-CM | POA: Diagnosis not present

## 2015-11-18 DIAGNOSIS — Z7984 Long term (current) use of oral hypoglycemic drugs: Secondary | ICD-10-CM | POA: Diagnosis not present

## 2015-11-18 DIAGNOSIS — I10 Essential (primary) hypertension: Secondary | ICD-10-CM | POA: Diagnosis not present

## 2015-11-18 DIAGNOSIS — Z8619 Personal history of other infectious and parasitic diseases: Secondary | ICD-10-CM | POA: Diagnosis not present

## 2015-11-18 DIAGNOSIS — Z7982 Long term (current) use of aspirin: Secondary | ICD-10-CM | POA: Insufficient documentation

## 2015-11-18 DIAGNOSIS — R079 Chest pain, unspecified: Secondary | ICD-10-CM | POA: Diagnosis present

## 2015-11-18 HISTORY — DX: Type 2 diabetes mellitus without complications: E11.9

## 2015-11-18 LAB — BASIC METABOLIC PANEL
ANION GAP: 7 (ref 5–15)
BUN: 13 mg/dL (ref 6–20)
CHLORIDE: 108 mmol/L (ref 101–111)
CO2: 28 mmol/L (ref 22–32)
Calcium: 10 mg/dL (ref 8.9–10.3)
Creatinine, Ser: 0.91 mg/dL (ref 0.61–1.24)
GFR calc Af Amer: >60 mL/min (ref >60)
Glucose, Bld: 98 mg/dL (ref 65–99)
POTASSIUM: 4.1 mmol/L (ref 3.5–5.1)
SODIUM: 143 mmol/L (ref 135–145)

## 2015-11-18 LAB — CBC
HEMATOCRIT: 39.8 % (ref 39.0–52.0)
HEMOGLOBIN: 13.3 g/dL (ref 13.0–17.0)
MCH: 30 pg (ref 26.0–34.0)
MCHC: 33.4 g/dL (ref 30.0–36.0)
MCV: 89.6 fL (ref 78.0–100.0)
Platelets: 253 10*3/uL (ref 150–400)
RBC: 4.44 MIL/uL (ref 4.22–5.81)
RDW: 12.4 % (ref 11.5–15.5)
WBC: 7.3 10*3/uL (ref 4.0–10.5)

## 2015-11-18 LAB — I-STAT TROPONIN, ED
TROPONIN I, POC: 0 ng/mL (ref 0.00–0.08)
Troponin i, poc: 0 ng/mL (ref 0.00–0.08)

## 2015-11-18 MED ORDER — NITROGLYCERIN 0.4 MG SL SUBL
0.4000 mg | SUBLINGUAL_TABLET | SUBLINGUAL | Status: AC | PRN
  Administered 2015-11-18 (×3): 0.4 mg via SUBLINGUAL
  Filled 2015-11-18: qty 1

## 2015-11-18 NOTE — ED Notes (Addendum)
Pt reports onset several days chest pain substernal, describes as tightness and constant.  Pain has not worsened.  Pt thinks he pulled a muscle.  No other s/s noted. NO pain relief with Tums.

## 2015-11-18 NOTE — ED Provider Notes (Signed)
CSN: [id number]     Arrival date & time 11/18/15  1317 History   First MD Initiated Contact with Patient 11/18/15 1651     Chief Complaint  Patient presents with  . Chest Pain     HPI  50 y.o. male with history of hypertension, hyperlipidemia, insulin-dependent diabetes, who presents with 3 days of substernal chest tightness. Pain is intermittent. No known aggravating or alleviating factors. Nonexertional and nonpleuritic. Denies radiation. No shortness of breath. Patient denies cough, fevers, nausea, vomiting, diaphoresis. No cardiac history. Pain is not worse with palpation, and is currently 3 out of 10 in severity.   Past Medical History  Diagnosis Date  . Hypertension   . Hypercholesteremia   . [facility]   . Diabetes mellitus without complication [facility](HCC)    Past Surgical History  Procedure Laterality Date  . Craniotomy      subdural hematoma    Family History  Problem Relation Age of Onset  . Diabetes Father    Social History  Substance Use Topics  . Smoking status: Never Smoker   . Smokeless tobacco: Current User    Types: Chew  . Alcohol Use: 0.6 - 1.2 oz/week    1-2 Standard drinks or equivalent per week     Comment: Light Use    Review of Systems  Constitutional: Negative for fever, chills, activity change and appetite change.  HENT: Negative for congestion, facial swelling, rhinorrhea and sore throat.   Eyes: Negative for visual disturbance.  Respiratory: Negative for cough, shortness of breath and wheezing.   Cardiovascular: Positive for chest pain. Negative for palpitations and leg swelling.  Gastrointestinal: Negative for nausea, vomiting, abdominal pain, diarrhea, constipation and blood in stool.  Genitourinary: Negative for dysuria, frequency, hematuria, flank pain and difficulty urinating.  Musculoskeletal: Negative for myalgias, back pain, joint swelling, arthralgias, neck pain and neck stiffness.  Skin: Negative for rash.  Neurological:  Negative for dizziness, weakness, light-headedness and headaches.  Psychiatric/Behavioral: Negative for behavioral problems, confusion and agitation.      Allergies  Review of patient's allergies indicates no known allergies.  Home Medications   Prior to Admission medications   Medication Sig Start Date End Date Taking? Authorizing Provider  aspirin 81 MG tablet Take 81 mg by mouth daily.   Yes Historical Provider, MD  lisinopril (PRINIVIL,ZESTRIL) 40 MG tablet Take 40 mg by mouth daily.   Yes Historical Provider, MD  metFORMIN (GLUCOPHAGE) 1000 MG tablet Take 0.5 tablets (500 mg total) by mouth 2 (two) times daily. 02/09/15  Yes [name], DO  simvastatin (ZOCOR) 40 MG tablet Take 40 mg by mouth every morning.    Yes Historical Provider, MD   BP 123/93 mmHg  Pulse 73  Temp(Src) 97.8 F (36.6 C) (Oral)  Resp 14  Ht 6' (1.829 m)  Wt 118.207 kg  BMI 35.34 kg/m2  SpO2 96% Physical Exam  Constitutional: He is oriented to person, place, and time. He appears well-developed and well-nourished. No distress.  HENT:  Head: Normocephalic and atraumatic.  Right Ear: External ear normal.  Left Ear: External ear normal.  Nose: Nose normal.  Mouth/Throat: Oropharynx is clear and moist. No oropharyngeal exudate.  Eyes: Conjunctivae are normal. Pupils are equal, round, and reactive to light. Right eye exhibits no discharge. Left eye exhibits no discharge. No scleral icterus.  Neck: Normal range of motion. Neck supple. No tracheal deviation present.  Cardiovascular: Normal rate, regular rhythm, normal heart sounds and intact distal pulses.  Exam  reveals no gallop and no friction rub.   No murmur heard. Pulmonary/Chest: Effort normal and breath sounds normal. No respiratory distress. He has no wheezes. He has no rales. He exhibits no tenderness.  Abdominal: Soft. Bowel sounds are normal. He exhibits no distension and no mass. There is no tenderness. There is no rebound and no guarding.   Musculoskeletal: Normal range of motion. He exhibits no edema or tenderness.  Neurological: He is alert and oriented to person, place, and time. He exhibits normal muscle tone.  Skin: Skin is warm and dry. No rash noted. He is not diaphoretic.  Psychiatric: He has a normal mood and affect. His behavior is normal. Judgment and thought content normal.    ED Course  Procedures (including critical care time) Labs Review Labs Reviewed  BASIC METABOLIC PANEL  CBC  I-STAT TROPOININ, ED  Rosezena Sensor, ED    Imaging Review Dg Chest 2 View  11/18/2015  CLINICAL DATA:  Chest pain for several days EXAM: CHEST  2 VIEW COMPARISON:  None. FINDINGS: Cardiomediastinal silhouette is unremarkable. No acute infiltrate or pleural effusion. No pulmonary edema. Mild degenerative changes lower thoracic spine. Mild degenerative changes lumbar spine. IMPRESSION: No active cardiopulmonary disease. Mild degenerative changes lower thoracic and lumbar spine. Electronically Signed   By: Natasha Mead M.D.   On: 11/18/2015 13:53   I have personally reviewed and evaluated these images and lab results as part of my medical decision-making.   EKG Interpretation   Date/Time:  Tuesday November 18 2015 18:34:58 EDT Ventricular Rate:  57 PR Interval:  156 QRS Duration: 96 QT Interval:  460 QTC Calculation: 448 R Axis:   52 Text Interpretation:  Sinus rhythm ST elev, probable normal early repol  pattern no significant change since earlier in the day Confirmed by  GOLDSTON  MD, SCOTT ([id number]) on 11/18/2015 7:19:32 PM      MDM   Final diagnoses:  Other chest pain    Patient is generally well-appearing. No exertional component of chest pain. EKG not concerning for acute ischemic changes. Troponin 0.00 at 0 and 3 hours. Remainder of labs unremarkable. Patient denies shortness of breath and I doubt PE, coronary artery disease, and aortic dissection. No focal consolidations to suggest pneumonia. HEART score of 4. After  extensive discussion with the patient and his wife regarding his risk factors for coronary artery disease, as part of shared decision-making, they would like to follow up with cardiology as an outpatient to obtain a nuclear stress test instead of being admitted to the hospital. I contacted Dr. Jacinto Halim from cardiology, who will arrange a stress test in the coming days for the patient. Patient was given strict precautions to return to the ED immediately for worsening pain with exertional component or shortness of breath. He expressed agreement and understanding with this plan and will follow up as appropriate.     Daralyn Bert Ernestina Penna, MD 11/19/15 4098  Pricilla Loveless, MD 11/19/15 [phone]

## 2015-11-18 NOTE — ED Notes (Signed)
Resident at the bedside

## 2016-11-07 ENCOUNTER — Encounter (HOSPITAL_COMMUNITY): Payer: Self-pay | Admitting: Emergency Medicine

## 2016-11-07 ENCOUNTER — Ambulatory Visit (INDEPENDENT_AMBULATORY_CARE_PROVIDER_SITE_OTHER): Payer: BC Managed Care – PPO

## 2016-11-07 ENCOUNTER — Ambulatory Visit (HOSPITAL_COMMUNITY)
Admission: EM | Admit: 2016-11-07 | Discharge: 2016-11-07 | Disposition: A | Discharge status: Home / Self Care | Payer: BC Managed Care – PPO | Attending: Family Medicine | Admitting: Family Medicine

## 2016-11-07 DIAGNOSIS — R1084 Generalized abdominal pain: Secondary | ICD-10-CM

## 2016-11-07 NOTE — Discharge Instructions (Signed)
There is no evidence of a bowel obstruction. I would use miralax daily for the next several weeks and increase your fluid intake.  If no relief within the next few days, follow up with your primary care provider.

## 2016-11-07 NOTE — ED Triage Notes (Signed)
[facility] a [facility] of [facility] pain and bloating with N/D x 1 week. The patient stated that he just finished a prescription of Tamiflu for influenza.

## 2016-11-07 NOTE — ED Provider Notes (Signed)
CSN: [id number]     Arrival date & time 11/07/16  1217 History   First MD Initiated Contact with Patient 11/07/16 1354     Chief Complaint  Patient presents with  . Abdominal Pain   (Consider location/radiation/quality/duration/timing/severity/associated sxs/prior Treatment)  HPI   The patient is a 51 year old male presenting today with complaints of abdominal pain with mild nausea and small amounts of diarrhea for approximately one week. Patient states he was recently diagnosed with the flu for which he was taking Tamiflu. His appetite has been diminished however he feels as though he is "blocked up".  Patient is concerned because he states the amount of diarrhea he is having is very small compared to the abdominal fullness he is feeling.  Past Medical History:  Diagnosis Date  . Diabetes mellitus without complication (HCC)   . Hypercholesteremia   . Hypertension   . Rocky Mountain spotted fever    Past Surgical History:  Procedure Laterality Date  . CRANIOTOMY     subdural hematoma    Family History  Problem Relation Age of Onset  . Diabetes Father    Social History  Substance Use Topics  . Smoking status: Never Smoker  . Smokeless tobacco: Current User    Types: Chew  . Alcohol use 0.6 - 1.2 oz/week    1 - 2 Standard drinks or equivalent per week     Comment: Light Use    Review of Systems  Constitutional: Negative.  Negative for fatigue and fever.  HENT: Negative.   Eyes: Negative.   Respiratory: Negative.  Negative for cough and shortness of breath.   Cardiovascular: Negative.   Gastrointestinal: Positive for abdominal distention, abdominal pain, constipation, diarrhea and nausea. Negative for anal bleeding, blood in stool and vomiting.  Endocrine: Negative.   Genitourinary: Negative.  Negative for decreased urine volume, difficulty urinating, flank pain, frequency and urgency.  Musculoskeletal: Negative.   Skin: Negative.   Allergic/Immunologic: Negative.    Neurological: Negative.  Negative for headaches.  Hematological: Negative.   Psychiatric/Behavioral: Negative.     Allergies  Patient has no known allergies.  Home Medications   Prior to Admission medications   Medication Sig Start Date End Date Taking? Authorizing Provider  aspirin 81 MG tablet Take 81 mg by mouth daily.   Yes Historical Provider, MD  lisinopril (PRINIVIL,ZESTRIL) 40 MG tablet Take 40 mg by mouth daily.   Yes Historical Provider, MD  metFORMIN (GLUCOPHAGE) 1000 MG tablet Take 0.5 tablets (500 mg total) by mouth 2 (two) times daily. 02/09/15  Yes [name], DO  simvastatin (ZOCOR) 40 MG tablet Take 40 mg by mouth every morning.    Yes Historical Provider, MD   Meds Ordered and Administered this Visit  Medications - No data to display  BP 131/74 (BP Location: Right Arm)   Pulse 88   Temp 98.7 F (37.1 C) (Oral)   Resp 18   SpO2 99%  No data found.   Physical Exam  Constitutional: He is oriented to person, place, and time. He appears well-developed and well-nourished. No distress.  Cardiovascular: Normal rate, regular rhythm, normal heart sounds and intact distal pulses.  Exam reveals no gallop and no friction rub.   No murmur heard. Pulmonary/Chest: Effort normal and breath sounds normal. No respiratory distress. He has no wheezes. He has no rales. He exhibits no tenderness.  Abdominal: Soft. He exhibits no distension and no mass. There is no tenderness. There is no rebound and no guarding. No hernia.  Hypoactive bowel sounds in RU and Bilateral lower quadrants.  Abdomen is rounded and full.  Not able to palpate any firm stool.  Neurological: He is alert and oriented to person, place, and time.  Skin: Skin is warm and dry. He is not diaphoretic.  Nursing note and vitals reviewed.   Urgent Care Course     Procedures (including critical care time)  Labs Review Labs Reviewed - No data to display  Imaging Review Dg Abd 1 View  Result Date:  11/07/2016 CLINICAL DATA:  Abdominal bloating and tenderness EXAM: ABDOMEN - 1 VIEW COMPARISON:  None. FINDINGS: There is moderate stool throughout the colon. There is no bowel dilatation or air-fluid level suggesting bowel obstruction. No free air. There is a small phlebolith in the lower right pelvis. IMPRESSION: No bowel obstruction or free air.  Moderate stool in colon. Electronically Signed   By: Bretta Bang III M.D.   On: 11/07/2016 14:31    Discussed with patient's that there is no evidence of bowel obstruction. Patient to take Miralax and increase water daily for the next several days if no significant improvement or he is still feeling uncomfortable to follow-up with his primary care provider.  MDM   1. Generalized abdominal pain    The usual and customary discharge instructions and warnings were given.  The patient verbalizes understanding and agrees to plan of care.       Servando Salina, NP 11/07/16 [phone]

## 2016-11-11 ENCOUNTER — Emergency Department (HOSPITAL_COMMUNITY)
Admission: EM | Admit: 2016-11-11 | Discharge: 2016-11-11 | Disposition: A | Discharge status: Home / Self Care | Payer: BC Managed Care – PPO | Attending: Emergency Medicine | Admitting: Emergency Medicine

## 2016-11-11 ENCOUNTER — Encounter (HOSPITAL_COMMUNITY): Payer: Self-pay

## 2016-11-11 ENCOUNTER — Emergency Department (HOSPITAL_COMMUNITY): Payer: BC Managed Care – PPO

## 2016-11-11 DIAGNOSIS — I10 Essential (primary) hypertension: Secondary | ICD-10-CM | POA: Insufficient documentation

## 2016-11-11 DIAGNOSIS — Z7982 Long term (current) use of aspirin: Secondary | ICD-10-CM | POA: Diagnosis not present

## 2016-11-11 DIAGNOSIS — M25562 Pain in left knee: Secondary | ICD-10-CM | POA: Insufficient documentation

## 2016-11-11 DIAGNOSIS — E119 Type 2 diabetes mellitus without complications: Secondary | ICD-10-CM | POA: Diagnosis not present

## 2016-11-11 DIAGNOSIS — M25561 Pain in right knee: Secondary | ICD-10-CM | POA: Insufficient documentation

## 2016-11-11 LAB — COMPREHENSIVE METABOLIC PANEL
ALBUMIN: 3.2 g/dL — AB (ref 3.5–5.0)
ALT: 40 U/L (ref 17–63)
ANION GAP: 7 (ref 5–15)
AST: 24 U/L (ref 15–41)
Alkaline Phosphatase: 74 U/L (ref 38–126)
BUN: 7 mg/dL (ref 6–20)
CALCIUM: 9.3 mg/dL (ref 8.9–10.3)
CHLORIDE: 103 mmol/L (ref 101–111)
CO2: 29 mmol/L (ref 22–32)
CREATININE: 0.9 mg/dL (ref 0.61–1.24)
GFR calc non Af Amer: >60 mL/min (ref >60)
Glucose, Bld: 118 mg/dL — ABNORMAL HIGH (ref 65–99)
Potassium: 4.1 mmol/L (ref 3.5–5.1)
SODIUM: 139 mmol/L (ref 135–145)
Total Bilirubin: 1.6 mg/dL — ABNORMAL HIGH (ref 0.3–1.2)
Total Protein: 6.9 g/dL (ref 6.5–8.1)

## 2016-11-11 LAB — URINALYSIS, ROUTINE W REFLEX MICROSCOPIC
Bacteria, UA: NONE SEEN
Bilirubin Urine: NEGATIVE
Glucose, UA: >=500 mg/dL — AB
HGB URINE DIPSTICK: NEGATIVE
Ketones, ur: NEGATIVE mg/dL
Leukocytes, UA: NEGATIVE
NITRITE: NEGATIVE
Protein, ur: 30 mg/dL — AB
SPECIFIC GRAVITY, URINE: 1.026 (ref 1.005–1.030)
Squamous Epithelial / LPF: NONE SEEN
pH: 5 (ref 5.0–8.0)

## 2016-11-11 LAB — SYNOVIAL CELL COUNT + DIFF, W/ CRYSTALS
Crystals, Fluid: NONE SEEN
Lymphocytes-Synovial Fld: 33 % — ABNORMAL HIGH (ref 0–20)
MONOCYTE-MACROPHAGE-SYNOVIAL FLUID: 21 % — AB (ref 50–90)
NEUTROPHIL, SYNOVIAL: 46 % — AB (ref 0–25)
WBC, Synovial: 6400 /mm3 — ABNORMAL HIGH (ref 0–200)

## 2016-11-11 LAB — CBC WITH DIFFERENTIAL/PLATELET
Basophils Absolute: 0 10*3/uL (ref 0.0–0.1)
Basophils Relative: 0 %
EOS ABS: 0.1 10*3/uL (ref 0.0–0.7)
EOS PCT: 1 %
HCT: 36.2 % — ABNORMAL LOW (ref 39.0–52.0)
Hemoglobin: 12 g/dL — ABNORMAL LOW (ref 13.0–17.0)
LYMPHS ABS: 2.1 10*3/uL (ref 0.7–4.0)
Lymphocytes Relative: 15 %
MCH: 29.9 pg (ref 26.0–34.0)
MCHC: 33.1 g/dL (ref 30.0–36.0)
MCV: 90 fL (ref 78.0–100.0)
Monocytes Absolute: 1.5 10*3/uL — ABNORMAL HIGH (ref 0.1–1.0)
Monocytes Relative: 11 %
Neutro Abs: 10.3 10*3/uL — ABNORMAL HIGH (ref 1.7–7.7)
Neutrophils Relative %: 73 %
PLATELETS: 326 10*3/uL (ref 150–400)
RBC: 4.02 MIL/uL — AB (ref 4.22–5.81)
RDW: 13.5 % (ref 11.5–15.5)
WBC: 14.1 10*3/uL — ABNORMAL HIGH (ref 4.0–10.5)

## 2016-11-11 LAB — SEDIMENTATION RATE: SED RATE: 90 mm/h — AB (ref 0–16)

## 2016-11-11 LAB — I-STAT CG4 LACTIC ACID, ED: Lactic Acid, Venous: 0.76 mmol/L (ref 0.5–1.9)

## 2016-11-11 MED ORDER — LIDOCAINE HCL (PF) 1 % IJ SOLN
INTRAMUSCULAR | Status: AC
  Administered 2016-11-11: 2.1 mL
  Filled 2016-11-11: qty 5

## 2016-11-11 MED ORDER — AMOXICILLIN-POT CLAVULANATE 875-125 MG PO TABS: 1.0000 | ORAL_TABLET | Freq: Two times a day (BID) | ORAL | 0 refills | Status: DC

## 2016-11-11 MED ORDER — CEFTRIAXONE SODIUM 1 G IJ SOLR
1.0000 g | Freq: Once | INTRAMUSCULAR | Status: AC
  Administered 2016-11-11: 1 g via INTRAMUSCULAR
  Filled 2016-11-11: qty 10

## 2016-11-11 MED ORDER — LIDOCAINE-EPINEPHRINE (PF) 2 %-1:200000 IJ SOLN
10.0000 mL | Freq: Once | INTRAMUSCULAR | Status: AC
  Administered 2016-11-11: 5 mL
  Filled 2016-11-11: qty 20

## 2016-11-11 NOTE — ED Notes (Signed)
Attempted to ambulate pt in room with HenagarKhatri, [place] -- Pt unable to walk or take full steps.

## 2016-11-11 NOTE — ED Notes (Signed)
Pt has been ill since 3/20 with body aches, nausea, some vomiting.  Had 100.3 oral temp last night.  Some diarrhea.  Two days ago began experiencing bilat knee pain, difficulty walking.  This morning is barely able to stand and shuffle feet.  Saw PMD yesterday without any results.

## 2016-11-11 NOTE — Discharge Instructions (Signed)
Begin taking Augmentin twice daily for 5 days. Alternate ibuprofen and Tylenol to help with knee pain. Rest leg as much as possible. Follow up with primary care doctor as soon as possible. Follow up on blood cultures which will be available in 2 days. Return to ED for any increase in pain, acute injury, increased swelling, change in mental status, chest pain, trouble breathing.

## 2016-11-11 NOTE — ED Notes (Signed)
Unable

## 2016-11-11 NOTE — ED Notes (Signed)
Pt. Wife in hallway asking about patient synovial fluid being sent to lab. RN informed patient that the samples had to be walked down to the lab. Pt. RN notified at this time about her concerns.

## 2016-11-11 NOTE — ED Notes (Signed)
Patient transported to X-ray 

## 2016-11-11 NOTE — ED Provider Notes (Signed)
[facility] DEPT Provider Note   CSN: [id number] Arrival date & time: 11/11/16  6834     History   Chief Complaint Chief Complaint  Patient presents with  . Knee Pain    HPI Derrick Duncan is a 51 y.o. male.  Patient presents with neck pain radiating to shoulders, headache and bilateral knee swelling and pain for the past 3 days.  About 9 days ago, he began having bodyaches, fever, cough and was diagnosed with influenza and treated with Tamiflu per request due to having a daughter with leukemia at home. After about 4 days of treatment, he began having diarrhea which has still not resolved. After that, he began having bilateral knee swelling and a persistent dull headache with associated neck and shoulder pain. Knee pain worsens with weight bearing and sitting up from standing. Also reports fever of 100.42F last night. Neither neck or knee pain has improved with ibuprofen.  Denies chest pain, SOB, palpitations, recent injury, loss of consciousness, AMS.  History is provided by patient and wife. Patient reports medication compliance.      Past Medical History:  Diagnosis Date  . Diabetes mellitus without complication ([place])   . Hypercholesteremia   . Hypertension   . Rocky Mountain spotted fever     Patient Active Problem List   Diagnosis Date Noted  . Hypercholesteremia     Past Surgical History:  Procedure Laterality Date  . CRANIOTOMY     subdural hematoma        Home Medications    Prior to Admission medications   Medication Sig Start Date End Date Taking? Authorizing Provider  aspirin 81 MG tablet Take 81 mg by mouth daily.   Yes Historical Provider, MD  bismuth subsalicylate (PEPTO BISMOL) 262 MG chewable tablet Chew 524 mg by mouth as needed for indigestion.   Yes Historical Provider, MD  ibuprofen (ADVIL,MOTRIN) 200 MG tablet Take 200 mg by mouth every 6 (six) hours as needed for moderate pain.   Yes Historical Provider, MD  lisinopril  (PRINIVIL,ZESTRIL) 40 MG tablet Take 40 mg by mouth daily.   Yes Historical Provider, MD  metFORMIN (GLUCOPHAGE) 1000 MG tablet Take 0.5 tablets (500 mg total) by mouth 2 (two) times daily. Patient taking differently: Take 1,000 mg by mouth 2 (two) times daily.  02/09/15  Yes [name], DO  simvastatin (ZOCOR) 40 MG tablet Take 40 mg by mouth every morning.    Yes Historical Provider, MD  amoxicillin-clavulanate (AUGMENTIN) 875-125 MG tablet Take 1 tablet by mouth every 12 (twelve) hours. 11/11/16   [name], PA    Family History Family History  Problem Relation Age of Onset  . Diabetes Father     Social History Social History  Substance Use Topics  . Smoking status: Never Smoker  . Smokeless tobacco: Current User    Types: Chew  . Alcohol use 0.6 - 1.2 oz/week    1 - 2 Standard drinks or equivalent per week     Comment: Light Use     Allergies   Patient has no known allergies.   Review of Systems Review of Systems  Constitutional: Positive for fever. Negative for appetite change and chills.  HENT: Positive for rhinorrhea. Negative for ear pain, sneezing and sore throat.   Eyes: Negative for photophobia and visual disturbance.  Respiratory: Negative for cough, chest tightness, shortness of breath and wheezing.   Cardiovascular: Negative for chest pain and palpitations.  Gastrointestinal: Positive for diarrhea. Negative for abdominal pain,  blood in stool, constipation, nausea and vomiting.  Endocrine: Negative for polydipsia and polyuria.  Genitourinary: Negative for dysuria, hematuria and urgency.  Musculoskeletal: Positive for arthralgias, joint swelling, neck pain and neck stiffness. Negative for myalgias.  Skin: Negative for rash.  Neurological: Negative for dizziness, weakness and light-headedness.     Physical Exam Updated Vital Signs BP (!) 144/87 (BP Location: Left Arm)   Pulse 94   Temp 99.7 F (37.6 C) (Oral)   Resp 16   Ht 6' (1.829 m)   Wt 113.4  kg   SpO2 97%   BMI 33.91 kg/m   Physical Exam  Constitutional: He is oriented to person, place, and time. He appears well-developed and well-nourished. No distress.  HENT:  Head: Normocephalic and atraumatic.  Nose: Nose normal.  Eyes: Conjunctivae and EOM are normal. Pupils are equal, round, and reactive to light. Right eye exhibits no discharge. Left eye exhibits no discharge. No scleral icterus.  Neck: Normal range of motion. Neck supple. No Brudzinski's sign and no Kernig's sign noted.  Cardiovascular: Normal rate, regular rhythm, normal heart sounds and intact distal pulses.  Exam reveals no gallop and no friction rub.   No murmur heard. Pulmonary/Chest: Effort normal and breath sounds normal. No respiratory distress.  Abdominal: Soft. Bowel sounds are normal. He exhibits no distension. There is no tenderness. There is no guarding.  Musculoskeletal: Normal range of motion. He exhibits edema and tenderness. He exhibits no deformity.       Right knee: He exhibits swelling. He exhibits no ecchymosis and no erythema. Tenderness found.       Left knee: He exhibits swelling. He exhibits no ecchymosis and no deformity. Tenderness found.  Lymphadenopathy:    He has no cervical adenopathy.  Neurological: He is alert and oriented to person, place, and time. He has normal strength. No cranial nerve deficit or sensory deficit. He exhibits normal muscle tone. Coordination normal.  Skin: Skin is warm and dry. No rash noted. No erythema.  Psychiatric: He has a normal mood and affect.  Nursing note and vitals reviewed.    ED Treatments / Results  Labs (all labs ordered are listed, but only abnormal results are displayed) Labs Reviewed  CBC WITH DIFFERENTIAL/PLATELET - Abnormal; Notable for the following:       Result Value   WBC 14.1 (*)    RBC 4.02 (*)    Hemoglobin 12.0 (*)    HCT 36.2 (*)    Neutro Abs 10.3 (*)    Monocytes Absolute 1.5 (*)    All other components within normal  limits  COMPREHENSIVE METABOLIC PANEL - Abnormal; Notable for the following:    Glucose, Bld 118 (*)    Albumin 3.2 (*)    Total Bilirubin 1.6 (*)    All other components within normal limits  URINALYSIS, ROUTINE W REFLEX MICROSCOPIC - Abnormal; Notable for the following:    Glucose, UA >=500 (*)    Protein, ur 30 (*)    All other components within normal limits  SEDIMENTATION RATE - Abnormal; Notable for the following:    Sed Rate 90 (*)    All other components within normal limits  SYNOVIAL CELL COUNT + DIFF, W/ CRYSTALS - Abnormal; Notable for the following:    Appearance-Synovial CLOUDY (*)    WBC, Synovial 6,400 (*)    Neutrophil, Synovial 46 (*)    Lymphocytes-Synovial Fld 33 (*)    Monocyte-Macrophage-Synovial Fluid 21 (*)    All other components within normal limits  BODY FLUID CULTURE  CULTURE, BLOOD (ROUTINE X 2)  CULTURE, BLOOD (ROUTINE X 2)  GLUCOSE, SYNOVIAL FLUID  PROTEIN, SYNOVIAL FLUID  I-STAT CG4 LACTIC ACID, ED    EKG  EKG Interpretation None       Radiology Dg Chest 2 View  Result Date: 11/11/2016 CLINICAL DATA:  Fever EXAM: CHEST  2 VIEW COMPARISON:  11/18/2015 FINDINGS: Cardiac enlargement without heart failure. Lungs are clear without infiltrate effusion or mass. No change from the prior study. Thoracic osteophytes. IMPRESSION: No active cardiopulmonary disease. Electronically Signed   By: Franchot Gallo M.D.   On: 11/11/2016 15:12    Procedures Procedures (including critical care time)  Medications Ordered in ED Medications  cefTRIAXone (ROCEPHIN) injection 1 g (not administered)  lidocaine-EPINEPHrine (XYLOCAINE W/EPI) 2 %-1:200000 (PF) injection 10 mL (5 mLs Infiltration Given 11/11/16 1400)     Initial Impression / Assessment and Plan / ED Course  I have reviewed the triage vital signs and the nursing notes.  Pertinent labs & imaging results that were available during my care of the patient were reviewed by me and considered in my medical  decision making (see chart for details).     Patient's history and symptoms concerning for post viral syndrome vs. bilateral joint infection. CBC showed leukocytosis at 14.1. CMP, lactic acid were unremarkable.  ESR was elevated at 90. Dr. Ashok Cordia did aspiration of the L knee effusion and was able to remove ~52m of fluid. This was sent for fluid analysis and cell counts. Provided some relief for the patient. Cell counts of synovial fluid returned with WBC count of 16k. This could signify inflammation, although the value is too low for consideration of an infected joint. Patient's CXR showed no active cardiopulmonary disease. Unclear etiology of leukocytosis and elevated ESR. Could be due to viral or bacterial process. Blood cultures pending. Patient given Rocephin 1g in ED. Will be sent home with Augmentin x5 days. Advised to alternate ibuprofen and Tylenol for knee pain. Advised to follow up with PCP as soon as possible and f/u results of blood culture in two days. Strict return precautions were given.   Final Clinical Impressions(s) / ED Diagnoses   Final diagnoses:  Acute pain of both knees    New Prescriptions New Prescriptions   AMOXICILLIN-CLAVULANATE (AUGMENTIN) 875-125 MG TABLET    Take 1 tablet by mouth every 12 (twelve) hours.     [place]03/29[place]18 [place] MD 11/16/16 2255

## 2016-11-11 NOTE — ED Triage Notes (Signed)
Per Pt, Pt is coming from home with complaints of bilateral swelling in his knee. Reports having flu-like symptoms about nine days ago and was giving Tamiflu. Pt reports starting to have diarrhea after the medication that has continued. About a week ago, PT had a bilateral swelling in his knees. Neck pain has increased with stiffness and fevers have continued.

## 2016-11-11 NOTE — ED Notes (Signed)
Got patient into a gown on the monitor patient is resting 

## 2016-11-13 ENCOUNTER — Encounter (HOSPITAL_COMMUNITY): Payer: Self-pay | Admitting: *Deleted

## 2016-11-13 ENCOUNTER — Emergency Department (HOSPITAL_COMMUNITY)
Admission: EM | Admit: 2016-11-13 | Discharge: 2016-11-13 | Disposition: A | Discharge status: Home / Self Care | Payer: BC Managed Care – PPO | Source: Home / Self Care

## 2016-11-13 DIAGNOSIS — M7989 Other specified soft tissue disorders: Secondary | ICD-10-CM | POA: Insufficient documentation

## 2016-11-13 DIAGNOSIS — M064 Inflammatory polyarthropathy: Secondary | ICD-10-CM | POA: Diagnosis not present

## 2016-11-13 DIAGNOSIS — Z5321 Procedure and treatment not carried out due to patient leaving prior to being seen by health care provider: Secondary | ICD-10-CM

## 2016-11-13 DIAGNOSIS — Z87891 Personal history of nicotine dependence: Secondary | ICD-10-CM | POA: Insufficient documentation

## 2016-11-13 DIAGNOSIS — R509 Fever, unspecified: Secondary | ICD-10-CM | POA: Diagnosis not present

## 2016-11-13 NOTE — ED Triage Notes (Signed)
Registration called triage RN to report pt left waiting area.

## 2016-11-13 NOTE — ED Triage Notes (Addendum)
Pt c/o bilateral knee swelling and fever since Thursday. Fluid was drawn off the left knee on Thursday and placed on antibiotics at [facility] hospital due to increased WBC. Pt's wife reports pt has a decreased appetite, dark urine. Pt has trouble ambulating and has been using wheelchair at home, but is usually ambulatory with no difficulties.

## 2016-11-14 ENCOUNTER — Emergency Department (HOSPITAL_COMMUNITY): Payer: BC Managed Care – PPO

## 2016-11-14 ENCOUNTER — Inpatient Hospital Stay (HOSPITAL_COMMUNITY)
Admission: EM | Admit: 2016-11-14 | Discharge: 2016-11-18 | DRG: 554 | Disposition: A | Discharge status: Home / Self Care | Payer: BC Managed Care – PPO | Attending: Internal Medicine | Admitting: Internal Medicine

## 2016-11-14 ENCOUNTER — Encounter (HOSPITAL_COMMUNITY): Payer: Self-pay | Admitting: Emergency Medicine

## 2016-11-14 DIAGNOSIS — M1990 Unspecified osteoarthritis, unspecified site: Secondary | ICD-10-CM | POA: Diagnosis not present

## 2016-11-14 DIAGNOSIS — K219 Gastro-esophageal reflux disease without esophagitis: Secondary | ICD-10-CM | POA: Diagnosis present

## 2016-11-14 DIAGNOSIS — R5383 Other fatigue: Secondary | ICD-10-CM

## 2016-11-14 DIAGNOSIS — E119 Type 2 diabetes mellitus without complications: Secondary | ICD-10-CM | POA: Diagnosis present

## 2016-11-14 DIAGNOSIS — M170 Bilateral primary osteoarthritis of knee: Secondary | ICD-10-CM | POA: Diagnosis present

## 2016-11-14 DIAGNOSIS — E785 Hyperlipidemia, unspecified: Secondary | ICD-10-CM | POA: Diagnosis present

## 2016-11-14 DIAGNOSIS — R29898 Other symptoms and signs involving the musculoskeletal system: Secondary | ICD-10-CM

## 2016-11-14 DIAGNOSIS — I10 Essential (primary) hypertension: Secondary | ICD-10-CM | POA: Diagnosis present

## 2016-11-14 DIAGNOSIS — Z7982 Long term (current) use of aspirin: Secondary | ICD-10-CM

## 2016-11-14 DIAGNOSIS — M5412 Radiculopathy, cervical region: Secondary | ICD-10-CM

## 2016-11-14 DIAGNOSIS — Z7984 Long term (current) use of oral hypoglycemic drugs: Secondary | ICD-10-CM

## 2016-11-14 DIAGNOSIS — M064 Inflammatory polyarthropathy: Principal | ICD-10-CM | POA: Diagnosis present

## 2016-11-14 DIAGNOSIS — R531 Weakness: Secondary | ICD-10-CM | POA: Diagnosis present

## 2016-11-14 DIAGNOSIS — M25511 Pain in right shoulder: Secondary | ICD-10-CM | POA: Diagnosis present

## 2016-11-14 DIAGNOSIS — R197 Diarrhea, unspecified: Secondary | ICD-10-CM | POA: Diagnosis present

## 2016-11-14 DIAGNOSIS — M130 Polyarthritis, unspecified: Secondary | ICD-10-CM | POA: Diagnosis present

## 2016-11-14 DIAGNOSIS — E7800 Pure hypercholesterolemia, unspecified: Secondary | ICD-10-CM | POA: Diagnosis present

## 2016-11-14 DIAGNOSIS — R509 Fever, unspecified: Secondary | ICD-10-CM | POA: Diagnosis not present

## 2016-11-14 DIAGNOSIS — Z833 Family history of diabetes mellitus: Secondary | ICD-10-CM

## 2016-11-14 HISTORY — DX: Other symptoms and signs involving the musculoskeletal system: R29.898

## 2016-11-14 LAB — CBC WITH DIFFERENTIAL/PLATELET
Basophils Absolute: 0 10*3/uL (ref 0.0–0.1)
Basophils Relative: 0 %
Eosinophils Absolute: 0.1 10*3/uL (ref 0.0–0.7)
Eosinophils Relative: 1 %
HCT: 31.8 % — ABNORMAL LOW (ref 39.0–52.0)
Hemoglobin: 10.6 g/dL — ABNORMAL LOW (ref 13.0–17.0)
Lymphocytes Relative: 21 %
Lymphs Abs: 2.1 10*3/uL (ref 0.7–4.0)
MCH: 29.9 pg (ref 26.0–34.0)
MCHC: 33.3 g/dL (ref 30.0–36.0)
MCV: 89.8 fL (ref 78.0–100.0)
Monocytes Absolute: 0.9 10*3/uL (ref 0.1–1.0)
Monocytes Relative: 9 %
Neutro Abs: 6.9 10*3/uL (ref 1.7–7.7)
Neutrophils Relative %: 69 %
Platelets: 378 10*3/uL (ref 150–400)
RBC: 3.54 MIL/uL — ABNORMAL LOW (ref 4.22–5.81)
RDW: 14 % (ref 11.5–15.5)
WBC: 10 10*3/uL (ref 4.0–10.5)

## 2016-11-14 LAB — COMPREHENSIVE METABOLIC PANEL
ALT: 48 U/L (ref 17–63)
AST: 27 U/L (ref 15–41)
Albumin: 3 g/dL — ABNORMAL LOW (ref 3.5–5.0)
Alkaline Phosphatase: 82 U/L (ref 38–126)
Anion gap: 6 (ref 5–15)
BUN: 17 mg/dL (ref 6–20)
CO2: 25 mmol/L (ref 22–32)
Calcium: 9 mg/dL (ref 8.9–10.3)
Chloride: 108 mmol/L (ref 101–111)
Creatinine, Ser: 0.86 mg/dL (ref 0.61–1.24)
GFR calc Af Amer: >60 mL/min (ref >60)
GFR calc non Af Amer: >60 mL/min (ref >60)
Glucose, Bld: 138 mg/dL — ABNORMAL HIGH (ref 65–99)
Potassium: 3.8 mmol/L (ref 3.5–5.1)
Sodium: 139 mmol/L (ref 135–145)
Total Bilirubin: 0.5 mg/dL (ref 0.3–1.2)
Total Protein: 6.6 g/dL (ref 6.5–8.1)

## 2016-11-14 LAB — URINALYSIS, ROUTINE W REFLEX MICROSCOPIC
Bilirubin Urine: NEGATIVE
Glucose, UA: 50 mg/dL — AB
Hgb urine dipstick: NEGATIVE
Ketones, ur: NEGATIVE mg/dL
Leukocytes, UA: NEGATIVE
Nitrite: NEGATIVE
Protein, ur: NEGATIVE mg/dL
Specific Gravity, Urine: 1.03 (ref 1.005–1.030)
pH: 5 (ref 5.0–8.0)

## 2016-11-14 LAB — C-REACTIVE PROTEIN: CRP: 12.5 mg/dL — ABNORMAL HIGH (ref <1.0)

## 2016-11-14 LAB — I-STAT CG4 LACTIC ACID, ED
Lactic Acid, Venous: 1.31 mmol/L (ref 0.5–1.9)
Lactic Acid, Venous: 0.71 mmol/L (ref 0.5–1.9)

## 2016-11-14 LAB — SEDIMENTATION RATE: Sed Rate: 114 mm/hr — ABNORMAL HIGH (ref 0–16)

## 2016-11-14 LAB — LACTIC ACID, PLASMA: Lactic Acid, Venous: 0.9 mmol/L (ref 0.5–1.9)

## 2016-11-14 MED ORDER — ONDANSETRON HCL 4 MG/2ML IJ SOLN: 4.0000 mg | Freq: Four times a day (QID) | INTRAMUSCULAR | Status: DC | PRN

## 2016-11-14 MED ORDER — LISINOPRIL 40 MG PO TABS
40.0000 mg | ORAL_TABLET | Freq: Every day | ORAL | Status: DC
  Administered 2016-11-15 – 2016-11-18 (×4): 40 mg via ORAL
  Filled 2016-11-14 (×4): qty 1

## 2016-11-14 MED ORDER — SODIUM CHLORIDE 0.9 % IV SOLN
INTRAVENOUS | Status: DC
  Administered 2016-11-15: 01:00:00 via INTRAVENOUS

## 2016-11-14 MED ORDER — ACETAMINOPHEN 650 MG RE SUPP: 650.0000 mg | Freq: Four times a day (QID) | RECTAL | Status: DC | PRN

## 2016-11-14 MED ORDER — IBUPROFEN 200 MG PO TABS
800.0000 mg | ORAL_TABLET | Freq: Three times a day (TID) | ORAL | Status: DC | PRN
  Administered 2016-11-15 – 2016-11-18 (×9): 800 mg via ORAL
  Filled 2016-11-14 (×9): qty 4

## 2016-11-14 MED ORDER — IBUPROFEN 200 MG PO TABS
400.0000 mg | ORAL_TABLET | Freq: Once | ORAL | Status: AC
  Administered 2016-11-14: 400 mg via ORAL
  Filled 2016-11-14: qty 2

## 2016-11-14 MED ORDER — PANTOPRAZOLE SODIUM 40 MG PO TBEC
40.0000 mg | DELAYED_RELEASE_TABLET | Freq: Every day | ORAL | Status: DC
  Administered 2016-11-18: 40 mg via ORAL
  Filled 2016-11-14 (×3): qty 1

## 2016-11-14 MED ORDER — SIMVASTATIN 40 MG PO TABS
40.0000 mg | ORAL_TABLET | Freq: Every morning | ORAL | Status: DC
  Administered 2016-11-15 – 2016-11-18 (×4): 40 mg via ORAL
  Filled 2016-11-14 (×4): qty 1

## 2016-11-14 MED ORDER — ACETAMINOPHEN 325 MG PO TABS
650.0000 mg | ORAL_TABLET | Freq: Four times a day (QID) | ORAL | Status: DC | PRN
  Administered 2016-11-15 – 2016-11-18 (×10): 650 mg via ORAL
  Filled 2016-11-14 (×11): qty 2

## 2016-11-14 MED ORDER — IBUPROFEN 200 MG PO TABS
400.0000 mg | ORAL_TABLET | Freq: Once | ORAL | Status: AC
  Administered 2016-11-15: 400 mg via ORAL
  Filled 2016-11-14: qty 2

## 2016-11-14 MED ORDER — INSULIN ASPART 100 UNIT/ML ~~LOC~~ SOLN: 0.0000 [IU] | Freq: Three times a day (TID) | SUBCUTANEOUS | Status: DC

## 2016-11-14 MED ORDER — ONDANSETRON HCL 4 MG PO TABS: 4.0000 mg | ORAL_TABLET | Freq: Four times a day (QID) | ORAL | Status: DC | PRN

## 2016-11-14 NOTE — ED Notes (Addendum)
Ice water given to pt per Jeffrey(PA)

## 2016-11-14 NOTE — H&P (Addendum)
History and Physical    Derrick Duncan ZOX:[id number] DOB: 07-Jan-1966 DOA: 11/14/2016  PCP: Carylon Perches, MD  Patient coming from: Home.  Chief Complaint: Fever and joint swelling.  HPI: Derrick Duncan is a 51 y.o. male with history of diabetes mellitus type 2, hypertension, hyperlipidemia has been experiencing persistent fever chills joint swelling over the last 2 weeks. Patient's symptoms started on 11/03/2015. Patient had fever chills and joint pains. Denies any shortness of breath or productive cough at that time. Patient had called his PCP and since patient was unable to go to the clinic Tamiflu was called in. After taking Tamiflu patient started developing diarrhea and increasing joint swelling of the knees. Since the symptoms persisted patient came to the ER on November 12 7015 and had arthrocentesis of his left knee. Arthrocentesis fluid showed WBC of 6400. Up until now there has been no growth. Since patient was getting increasingly difficult to ambulate because of the joint pains and has been having fever and chills patient came to the ER today. Patient also has been having some mild headache in the forehead since the symptoms started. Patient also has been having persistent diarrhea which has become more dark after taking Pepto-Bismol. Denies any vomiting. Has been having poor appetite. Denies any sick contacts or recent travel. Patient also states over the last 24 hours has been having increasing weakness of the right upper and lower extremity due to pain involving the right shoulder and right knee.  ED Course: CT of the head was unremarkable. Patient had blood cultures drawn and since patient also has had previous history of [facility] fever, RMSF titers were obtained. Patient is being admitted for further observation. On exam patient does not have any neck rigidity or skin rash. Does have bilateral knee swelling and also right foot swelling. After patient's last visit patient was  discharged home on Augmentin.  Review of Systems: As per HPI, rest all negative.   Past Medical History:  Diagnosis Date  . Diabetes mellitus without complication (HCC)   . Hypercholesteremia   . Hypertension   . [place] spotted fever     Past Surgical History:  Procedure Laterality Date  . CRANIOTOMY     subdural hematoma      reports that he has never smoked. He has quit using smokeless tobacco. He reports that he does not drink alcohol or use drugs.  No Known Allergies  Family History  Problem Relation Age of Onset  . Diabetes Father     Prior to Admission medications   Medication Sig Start Date End Date Taking? Authorizing Provider  amoxicillin-clavulanate (AUGMENTIN) 875-125 MG tablet Take 1 tablet by mouth every 12 (twelve) hours. 11/11/16  Yes Hina Khatri, PA-C  aspirin 81 MG tablet Take 81 mg by mouth daily.   Yes Historical Provider, MD  bismuth subsalicylate (PEPTO BISMOL) 262 MG chewable tablet Chew 524 mg by mouth as needed for indigestion.   Yes Historical Provider, MD  ibuprofen (ADVIL,MOTRIN) 200 MG tablet Take 200 mg by mouth every 6 (six) hours as needed for moderate pain.   Yes Historical Provider, MD  lisinopril (PRINIVIL,ZESTRIL) 40 MG tablet Take 40 mg by mouth daily.   Yes Historical Provider, MD  metFORMIN (GLUCOPHAGE) 1000 MG tablet Take 0.5 tablets (500 mg total) by mouth 2 (two) times daily. Patient taking differently: Take 1,000 mg by mouth 2 (two) times daily.  02/09/15  Yes [name], DO  simvastatin (ZOCOR) 40 MG tablet Take 40  mg by mouth every morning.    Yes Historical Provider, MD    Physical Exam: Vitals:   11/14/16 1845 11/14/16 1900 11/14/16 1915 11/14/16 2123  BP:   (!) 135/99 (!) 146/92  Pulse: 70 72 68 77  Resp:    16  Temp:      TempSrc:    Oral  SpO2: 98% 97% 96% 99%      Constitutional: Moderately built and nourished. Vitals:   11/14/16 1845 11/14/16 1900 11/14/16 1915 11/14/16 2123  BP:   (!) 135/99 (!)  146/92  Pulse: 70 72 68 77  Resp:    16  Temp:      TempSrc:    Oral  SpO2: 98% 97% 96% 99%   Eyes: Anicteric no pallor. ENMT: No discharge from the ears eyes nose or mouth. Neck: No mass felt. No neck rigidity. Respiratory: No rhonchi or crepitations. Cardiovascular: S1-S2 heard no murmurs appreciated. Abdomen: Soft nontender bowel sounds present. No guarding or rigidity. Musculoskeletal: Bilateral knee swelling. Right foot swollen. Skin: No rash. Neurologic: Alert awake oriented to time place and person. Has weakness on moving his right upper and lower extremity due to pain in the shoulder and knee. No facial asymmetry tongue is midline. Psychiatric: Appears normal. Normal affect.   Labs on Admission: I have personally reviewed following labs and imaging studies  CBC:  Recent Labs Lab 11/11/16 1239 11/14/16 1424  WBC 14.1* 10.0  NEUTROABS 10.3* 6.9  HGB 12.0* 10.6*  HCT 36.2* 31.8*  MCV 90.0 89.8  PLT 326 378   Basic Metabolic Panel:  Recent Labs Lab 11/11/16 1239 11/14/16 1424  NA 139 139  K 4.1 3.8  CL 103 108  CO2 29 25  GLUCOSE 118* 138*  BUN 7 17  CREATININE 0.90 0.86  CALCIUM 9.3 9.0   GFR: Estimated Creatinine Clearance: 137.7 mL/min (by C-G formula based on SCr of 0.86 mg/dL). Liver Function Tests:  Recent Labs Lab 11/11/16 1239 11/14/16 1424  AST 24 27  ALT 40 48  ALKPHOS 74 82  BILITOT 1.6* 0.5  PROT 6.9 6.6  ALBUMIN 3.2* 3.0*   No results for input(s): LIPASE, AMYLASE in the last 168 hours. No results for input(s): AMMONIA in the last 168 hours. Coagulation Profile: No results for input(s): INR, PROTIME in the last 168 hours. Cardiac Enzymes: No results for input(s): CKTOTAL, CKMB, CKMBINDEX, TROPONINI in the last 168 hours. BNP (last 3 results) No results for input(s): PROBNP in the last 8760 hours. HbA1C: No results for input(s): HGBA1C in the last 72 hours. CBG: No results for input(s): GLUCAP in the last 168 hours. Lipid  Profile: No results for input(s): CHOL, HDL, LDLCALC, TRIG, CHOLHDL, LDLDIRECT in the last 72 hours. Thyroid Function Tests: No results for input(s): TSH, T4TOTAL, FREET4, T3FREE, THYROIDAB in the last 72 hours. Anemia Panel: No results for input(s): VITAMINB12, FOLATE, FERRITIN, TIBC, IRON, RETICCTPCT in the last 72 hours. Urine analysis:    Component Value Date/Time   COLORURINE YELLOW 11/14/2016 1625   APPEARANCEUR CLEAR 11/14/2016 1625   LABSPEC 1.030 11/14/2016 1625   PHURINE 5.0 11/14/2016 1625   GLUCOSEU 50 (A) 11/14/2016 1625   HGBUR NEGATIVE 11/14/2016 1625   BILIRUBINUR NEGATIVE 11/14/2016 1625   KETONESUR NEGATIVE 11/14/2016 1625   PROTEINUR NEGATIVE 11/14/2016 1625   UROBILINOGEN 0.2 02/09/2015 1500   NITRITE NEGATIVE 11/14/2016 1625   LEUKOCYTESUR NEGATIVE 11/14/2016 1625   Sepsis Labs: (procalcitonin:4,lacticidven:4) ) Recent Results (from the past 240 hour(s))  Body fluid culture  Status: None (Preliminary result)   Collection Time: 11/11/16  2:37 PM  Result Value Ref Range Status   Specimen Description SYNOVIAL KNEE  Final   Special Requests NONE  Final   Gram Stain   Final    ABUNDANT WBC PRESENT,BOTH PMN AND MONONUCLEAR NO ORGANISMS SEEN    Culture NO GROWTH 3 DAYS  Final   Report Status PENDING  Incomplete  Blood culture (routine x 2)     Status: None (Preliminary result)   Collection Time: 11/11/16  3:20 PM  Result Value Ref Range Status   Specimen Description BLOOD RIGHT ANTECUBITAL  Final   Special Requests BOTTLES DRAWN AEROBIC AND ANAEROBIC 5CC  Final   Culture NO GROWTH 3 DAYS  Final   Report Status PENDING  Incomplete  Blood culture (routine x 2)     Status: None (Preliminary result)   Collection Time: 11/11/16  3:25 PM  Result Value Ref Range Status   Specimen Description BLOOD RIGHT HAND  Final   Special Requests BOTTLES DRAWN AEROBIC AND ANAEROBIC 5CC  Final   Culture NO GROWTH 3 DAYS  Final   Report Status PENDING   Incomplete     Radiological Exams on Admission: Dg Chest 2 View  Result Date: 11/14/2016 CLINICAL DATA:  Fever EXAM: CHEST  2 VIEW COMPARISON:  11/11/2016 chest radiograph. FINDINGS: Stable cardiomediastinal silhouette with normal heart size. No pneumothorax. No pleural effusion. Lungs appear clear, with no acute consolidative airspace disease and no pulmonary edema. IMPRESSION: No active cardiopulmonary disease. Electronically Signed   By: Delbert Phenix M.D.   On: 11/14/2016 17:48   Ct Head Wo Contrast  Result Date: 11/14/2016 CLINICAL DATA:  Initial evaluation for acute headache, right-sided weakness. EXAM: CT HEAD WITHOUT CONTRAST TECHNIQUE: Contiguous axial images were obtained from the base of the skull through the vertex without intravenous contrast. COMPARISON:  Prior head CT from 01/05/2013. 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 Cerebral volume otherwise normal. No acute intracranial hemorrhage. No evidence for acute large vessel territory infarct. No mass lesion, midline shift or mass effect. No hydrocephalus. No extra-axial fluid collection. Vascular: No hyperdense vessel. Skull: Scalp soft tissues within normal limits.  Calvarium intact. Sinuses/Orbits: Globes and orbital soft tissues within normal limits. Mild scattered mucosal thickening within the ethmoidal air cells. Paranasal sinuses are otherwise clear. No mastoid effusion. Other: None. IMPRESSION: 1. No acute intracranial process. 2. 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 3. Otherwise negative head CT. Electronically Signed   By: Rise Mu M.D.   On: 11/14/2016 18:45     Assessment/Plan Active Problems:   HLD (hyperlipidemia)   Arthritis   Diabetes mellitus type 2, controlled (HCC)   Fever   Diarrhea   Essential hypertension    1. Fever with arthritis  - cause not clear. Differentials include primarily rheumatology: Infectious. Blood cultures, rheumatoid factor anti-CCCP, anti-double-stranded DNA, ANA, RMSF titer, parvovirus, LDH CK levels, stool studies since patient has diarrhea has been ordered. Along with peripheral smear studies have been ordered. Patient's sedimentation rate and CRP levels are at 114 and 12.5. May consult infectious disease in a.m. As patient has a right-sided weakness most likely from the right shoulder pain and right knee pain however I have ordered MRI of the C-spine and brain. 2. Diabetes mellitus type 2 - will keep patient on sliding scale coverage and hold metformin. 3. Hypertension - continue  lisinopril. 4. Hyperlipidemia on statins. 5. Anemia - check anemia panel and LDH and follow peripheral smear study.   DVT prophylaxis: SCDs. Code Status: Full code.  Family Communication: Discussed with patient's wife.  Disposition Plan: Home.  Consults called: None.  Admission status: Observation.    Eduard Clos MD [facility] Pager [phone].  If 7PM-7AM, please contact night-coverage [email or web address] Password [email or web address]  11/14/2016, 11:55 PM

## 2016-11-14 NOTE — ED Notes (Signed)
IV access attempted, right hand, without success. IV team consulted per family request.

## 2016-11-14 NOTE — ED Provider Notes (Signed)
[facility] DEPT Provider Note   CSN: [id number] Arrival date & time: 11/14/16  1330     History   Chief Complaint Chief Complaint  Derrick Duncan presents with  . Knee Pain  . Fever    HPI Derrick Duncan is a 51 y.o. male.  HPI   51 year old male presents today complains of generalized fatigue, fever, and bilateral lower extremity swelling.  Derrick Duncan was seen for the same with tap of his left knee.  This showed no growth of organisms and was likely inflammatory.  Derrick Duncan is blood cultures showed no growth as well.  Derrick Duncan can use to endorse fatigue, fevers, inability to ambulate due to pain and swelling.   Derrick Duncan has proceeding influenza-like illness in March that was treated with Tamiflu.  Shortly after Derrick Duncan developed diarrhea.  Derrick Duncan reports some nausea and vomiting with the initial influenza-like illness, none presently.  Wife notes Derrick Duncan has been receiving Motrin and Tylenol along with Augmentin.   They note a significant past medical history of [facility] spotted fever in 2014, they note tics on their dogs recently but no known tick bites.   She also notes a slight headache over the last several days, none presently throughout my evaluation.  Past Medical History:  Diagnosis Date  . Diabetes mellitus without complication ([place])   . Hypercholesteremia   . Hypertension   . [place] spotted fever     Derrick Duncan Active Problem List   Diagnosis Date Noted  . Arthritis 11/14/2016  . Hypercholesteremia     Past Surgical History:  Procedure Laterality Date  . CRANIOTOMY     subdural hematoma      Home Medications    Prior to Admission medications   Medication Sig Start Date End Date Taking? Authorizing Provider  amoxicillin-clavulanate (AUGMENTIN) 875-125 MG tablet Take 1 tablet by mouth every 12 (twelve) hours. 11/11/16  Yes Hina Khatri, PA-C  aspirin 81 MG tablet Take 81 mg by mouth daily.   Yes Historical Provider, MD  bismuth subsalicylate (PEPTO  BISMOL) 262 MG chewable tablet Chew 524 mg by mouth as needed for indigestion.   Yes Historical Provider, MD  ibuprofen (ADVIL,MOTRIN) 200 MG tablet Take 200 mg by mouth every 6 (six) hours as needed for moderate pain.   Yes Historical Provider, MD  lisinopril (PRINIVIL,ZESTRIL) 40 MG tablet Take 40 mg by mouth daily.   Yes Historical Provider, MD  metFORMIN (GLUCOPHAGE) 1000 MG tablet Take 0.5 tablets (500 mg total) by mouth 2 (two) times daily. Derrick Duncan taking differently: Take 1,000 mg by mouth 2 (two) times daily.  02/09/15  Yes [name], DO  simvastatin (ZOCOR) 40 MG tablet Take 40 mg by mouth every morning.    Yes Historical Provider, MD    Family History Family History  Problem Relation Age of Onset  . Diabetes Father     Social History Social History  Substance Use Topics  . Smoking status: Never Smoker  . Smokeless tobacco: Former Systems developer  . Alcohol use No     Allergies   Derrick Duncan has no known allergies.   Review of Systems Review of Systems  All other systems reviewed and are negative.    Physical Exam Updated Vital Signs BP (!) 135/99   Pulse 68   Temp 98.1 F (36.7 C) (Oral)   Resp 18   SpO2 96%   Physical Exam  Constitutional: Derrick Duncan is oriented to person, place, and time. Derrick Duncan appears well-developed and well-nourished.  HENT:  Head: Normocephalic and atraumatic.  Eyes: Conjunctivae are normal. Pupils are equal, round, and reactive to light. Right eye exhibits no discharge. Left eye exhibits no discharge. No scleral icterus.  Neck: Normal range of motion. No JVD present. No tracheal deviation present.  Pulmonary/Chest: Effort normal and breath sounds normal. No stridor.  Abdominal: Soft. Derrick Duncan exhibits no distension and no mass. There is no tenderness. There is no rebound and no guarding. No hernia.  Musculoskeletal:  Swelling to bilateral knees.  Greater than 90 of flexion of the left, 90 of flexion on the right, no significant redness  Neurological: Derrick Duncan is  alert and oriented to person, place, and time. Coordination normal.  Psychiatric: Derrick Duncan has a normal mood and affect. His behavior is normal. Judgment and thought content normal.  Nursing note and vitals reviewed.    ED Treatments / Results  Labs (all labs ordered are listed, but only abnormal results are displayed) Labs Reviewed  CBC WITH DIFFERENTIAL/PLATELET - Abnormal; Notable for the following:       Result Value   RBC 3.54 (*)    Hemoglobin 10.6 (*)    HCT 31.8 (*)    All other components within normal limits  COMPREHENSIVE METABOLIC PANEL - Abnormal; Notable for the following:    Glucose, Bld 138 (*)    Albumin 3.0 (*)    All other components within normal limits  SEDIMENTATION RATE - Abnormal; Notable for the following:    Sed Rate 114 (*)    All other components within normal limits  C-REACTIVE PROTEIN - Abnormal; Notable for the following:    CRP 12.5 (*)    All other components within normal limits  URINALYSIS, ROUTINE W REFLEX MICROSCOPIC - Abnormal; Notable for the following:    Glucose, UA 50 (*)    All other components within normal limits  GASTROINTESTINAL PANEL BY PCR, STOOL (REPLACES STOOL CULTURE)  CULTURE, BLOOD (ROUTINE X 2)  CULTURE, BLOOD (ROUTINE X 2)  URINE CULTURE  ROCKY MTN SPOTTED FVR ABS PNL(IGG+IGM)  B. BURGDORFI ANTIBODIES  HLA-B27 ANTIGEN  RPR  RHEUMATOID FACTOR  URIC ACID  ANA W/REFLEX IF POSITIVE  ANTI-DNA ANTIBODY, DOUBLE-STRANDED  HIV ANTIBODY (ROUTINE TESTING)  I-STAT CG4 LACTIC ACID, ED  I-STAT CG4 LACTIC ACID, ED  GC/CHLAMYDIA PROBE AMP ([place]) NOT AT [facility]    EKG  EKG Interpretation None       Radiology Dg Chest 2 View  Result Date: 11/14/2016 CLINICAL DATA:  Fever EXAM: CHEST  2 VIEW COMPARISON:  11/11/2016 chest radiograph. FINDINGS: Stable cardiomediastinal silhouette with normal heart size. No pneumothorax. No pleural effusion. Lungs appear clear, with no acute consolidative airspace disease and no pulmonary edema.  IMPRESSION: No active cardiopulmonary disease. Electronically Signed   By: Ilona Sorrel M.D.   On: 11/14/2016 17:48   Ct Head Wo Contrast  Result Date: 11/14/2016 CLINICAL DATA:  Initial evaluation for acute headache, right-sided weakness. EXAM: CT HEAD WITHOUT CONTRAST TECHNIQUE: Contiguous axial images were obtained from the base of the skull through the vertex without intravenous contrast. COMPARISON:  Prior head CT from 01/05/2013. 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 Cerebral volume otherwise normal. No acute intracranial hemorrhage. No evidence for acute large vessel territory infarct. No mass lesion, midline shift or mass effect. No hydrocephalus. No extra-axial fluid collection. Vascular: No hyperdense vessel. Skull: Scalp soft tissues within normal limits.  Calvarium intact. Sinuses/Orbits: Globes and orbital soft tissues within normal limits. Mild scattered mucosal thickening  within the ethmoidal air cells. Paranasal sinuses are otherwise clear. No mastoid effusion. Other: None. IMPRESSION: 1. No acute intracranial process. 2. 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 3. Otherwise negative head CT. Electronically Signed   By: Jeannine Boga M.D.   On: 11/14/2016 18:45    Procedures Procedures (including critical care time)  Medications Ordered in ED Medications - No data to display   Initial Impression / Assessment and Plan / ED Course  I have reviewed the triage vital signs and the nursing notes.  Pertinent labs & imaging results that were available during my care of the Derrick Duncan were reviewed by me and considered in my medical decision making (see chart for details).      Final Clinical Impressions(s) / ED Diagnoses   Final diagnoses:  Arthritis  Fatigue, unspecified type    Labs: I-STAT lactic acid, blood  culture, HLA-B27, RPR, urinalysis, sed rate, CBC CMP  Imaging: CT head without contrast  Consults:  Therapeutics:  Discharge Meds:   Assessment/Plan: 51 year old male presents today with viral type illness.  Derrick Duncan having swelling in his bilateral knees.  Derrick Duncan continues to endorse fever at home although no fever here.  Derrick Duncan has worsening sed rate with an elevation in his CRP.  Due to abnormal presentation infectious disease was consulted.  They agreed that hospital admission for ongoing management with the most efficient for this Derrick Duncan.  Labs be placed, hospitalist be consulted, they would see the Derrick Duncan tomorrow.  Derrick Duncan agreed to this plan and had no further questions or concerns      New Prescriptions New Prescriptions   No medications on file     Okey Regal, PA-C 11/14/16 2042    Gwenyth Allegra Tegeler, MD 11/16/16 1110

## 2016-11-14 NOTE — ED Triage Notes (Signed)
As been see for knee pain before and had one tapped , still having pain and fever and diarrhea

## 2016-11-15 ENCOUNTER — Observation Stay (HOSPITAL_COMMUNITY): Payer: BC Managed Care – PPO

## 2016-11-15 DIAGNOSIS — M064 Inflammatory polyarthropathy: Principal | ICD-10-CM

## 2016-11-15 DIAGNOSIS — Z87891 Personal history of nicotine dependence: Secondary | ICD-10-CM

## 2016-11-15 DIAGNOSIS — I10 Essential (primary) hypertension: Secondary | ICD-10-CM | POA: Diagnosis present

## 2016-11-15 DIAGNOSIS — R5081 Fever presenting with conditions classified elsewhere: Secondary | ICD-10-CM

## 2016-11-15 DIAGNOSIS — Z7982 Long term (current) use of aspirin: Secondary | ICD-10-CM | POA: Diagnosis not present

## 2016-11-15 DIAGNOSIS — Z9889 Other specified postprocedural states: Secondary | ICD-10-CM | POA: Diagnosis not present

## 2016-11-15 DIAGNOSIS — E7800 Pure hypercholesterolemia, unspecified: Secondary | ICD-10-CM | POA: Diagnosis present

## 2016-11-15 DIAGNOSIS — E119 Type 2 diabetes mellitus without complications: Secondary | ICD-10-CM

## 2016-11-15 DIAGNOSIS — K219 Gastro-esophageal reflux disease without esophagitis: Secondary | ICD-10-CM | POA: Diagnosis present

## 2016-11-15 DIAGNOSIS — E785 Hyperlipidemia, unspecified: Secondary | ICD-10-CM | POA: Diagnosis present

## 2016-11-15 DIAGNOSIS — M130 Polyarthritis, unspecified: Secondary | ICD-10-CM | POA: Diagnosis present

## 2016-11-15 DIAGNOSIS — M170 Bilateral primary osteoarthritis of knee: Secondary | ICD-10-CM | POA: Diagnosis present

## 2016-11-15 DIAGNOSIS — R509 Fever, unspecified: Secondary | ICD-10-CM | POA: Diagnosis present

## 2016-11-15 DIAGNOSIS — Z8619 Personal history of other infectious and parasitic diseases: Secondary | ICD-10-CM

## 2016-11-15 DIAGNOSIS — Z833 Family history of diabetes mellitus: Secondary | ICD-10-CM

## 2016-11-15 DIAGNOSIS — R5383 Other fatigue: Secondary | ICD-10-CM | POA: Diagnosis not present

## 2016-11-15 DIAGNOSIS — R531 Weakness: Secondary | ICD-10-CM | POA: Diagnosis not present

## 2016-11-15 DIAGNOSIS — M5412 Radiculopathy, cervical region: Secondary | ICD-10-CM | POA: Diagnosis not present

## 2016-11-15 DIAGNOSIS — M1990 Unspecified osteoarthritis, unspecified site: Secondary | ICD-10-CM | POA: Diagnosis not present

## 2016-11-15 DIAGNOSIS — M25511 Pain in right shoulder: Secondary | ICD-10-CM | POA: Diagnosis present

## 2016-11-15 DIAGNOSIS — Z7984 Long term (current) use of oral hypoglycemic drugs: Secondary | ICD-10-CM | POA: Diagnosis not present

## 2016-11-15 DIAGNOSIS — R197 Diarrhea, unspecified: Secondary | ICD-10-CM | POA: Diagnosis present

## 2016-11-15 LAB — URINE CULTURE: CULTURE: NO GROWTH

## 2016-11-15 LAB — SYNOVIAL CELL COUNT + DIFF, W/ CRYSTALS
Crystals, Fluid: NONE SEEN
EOSINOPHILS-SYNOVIAL: 0 % (ref 0–1)
LYMPHOCYTES-SYNOVIAL FLD: 55 % — AB (ref 0–20)
Monocyte-Macrophage-Synovial Fluid: 34 % — ABNORMAL LOW (ref 50–90)
Neutrophil, Synovial: 11 % (ref 0–25)
WBC, Synovial: 5300 /mm3 — ABNORMAL HIGH (ref 0–200)

## 2016-11-15 LAB — GASTROINTESTINAL PANEL BY PCR, STOOL (REPLACES STOOL CULTURE)

## 2016-11-15 LAB — FOLATE: FOLATE: 10.8 ng/mL (ref >5.9)

## 2016-11-15 LAB — RETICULOCYTES
RBC.: 3.57 MIL/uL — ABNORMAL LOW (ref 4.22–5.81)
RETIC COUNT ABSOLUTE: 42.8 10*3/uL (ref 19.0–186.0)
Retic Ct Pct: 1.2 % (ref 0.4–3.1)

## 2016-11-15 LAB — CBC
HEMATOCRIT: 32.8 % — AB (ref 39.0–52.0)
HEMOGLOBIN: 10.5 g/dL — AB (ref 13.0–17.0)
MCH: 29.2 pg (ref 26.0–34.0)
MCHC: 32 g/dL (ref 30.0–36.0)
MCV: 91.4 fL (ref 78.0–100.0)
Platelets: 386 10*3/uL (ref 150–400)
RBC: 3.59 MIL/uL — ABNORMAL LOW (ref 4.22–5.81)
RDW: 14 % (ref 11.5–15.5)
WBC: 8.7 10*3/uL (ref 4.0–10.5)

## 2016-11-15 LAB — IRON AND TIBC
IRON: 21 ug/dL — AB (ref 45–182)
Saturation Ratios: 8 % — ABNORMAL LOW (ref 17.9–39.5)
TIBC: 272 ug/dL (ref 250–450)
UIBC: 251 ug/dL

## 2016-11-15 LAB — BASIC METABOLIC PANEL
ANION GAP: 7 (ref 5–15)
BUN: 13 mg/dL (ref 6–20)
CO2: 27 mmol/L (ref 22–32)
Calcium: 9.1 mg/dL (ref 8.9–10.3)
Chloride: 107 mmol/L (ref 101–111)
Creatinine, Ser: 0.75 mg/dL (ref 0.61–1.24)
GFR calc Af Amer: >60 mL/min (ref >60)
Glucose, Bld: 100 mg/dL — ABNORMAL HIGH (ref 65–99)
POTASSIUM: 4.1 mmol/L (ref 3.5–5.1)
SODIUM: 141 mmol/L (ref 135–145)

## 2016-11-15 LAB — URIC ACID: Uric Acid, Serum: 3.9 mg/dL — ABNORMAL LOW (ref 4.4–7.6)

## 2016-11-15 LAB — TSH: TSH: 0.621 u[IU]/mL (ref 0.350–4.500)

## 2016-11-15 LAB — LACTATE DEHYDROGENASE: LDH: 150 U/L (ref 98–192)

## 2016-11-15 LAB — VITAMIN B12: VITAMIN B 12: 398 pg/mL (ref 180–914)

## 2016-11-15 LAB — BODY FLUID CULTURE: Culture: NO GROWTH

## 2016-11-15 LAB — ROCKY MTN SPOTTED FVR ABS PNL(IGG+IGM)
RMSF IgG: NEGATIVE
RMSF IgM: 0.32 index (ref 0.00–0.89)

## 2016-11-15 LAB — PATHOLOGIST SMEAR REVIEW

## 2016-11-15 LAB — FERRITIN: Ferritin: 601 ng/mL — ABNORMAL HIGH (ref 24–336)

## 2016-11-15 LAB — C DIFFICILE QUICK SCREEN W PCR REFLEX
C DIFFICILE (CDIFF) INTERP: NOT DETECTED
C Diff antigen: NEGATIVE
C Diff toxin: NEGATIVE

## 2016-11-15 LAB — RPR: RPR Ser Ql: NONREACTIVE

## 2016-11-15 LAB — GLUCOSE, CAPILLARY
GLUCOSE-CAPILLARY: 137 mg/dL — AB (ref 65–99)
GLUCOSE-CAPILLARY: 126 mg/dL — AB (ref 65–99)
GLUCOSE-CAPILLARY: 105 mg/dL — AB (ref 65–99)
GLUCOSE-CAPILLARY: 123 mg/dL — AB (ref 65–99)

## 2016-11-15 LAB — CK: Total CK: 86 U/L (ref 49–397)

## 2016-11-15 LAB — HIV ANTIBODY (ROUTINE TESTING W REFLEX): HIV Screen 4th Generation wRfx: NONREACTIVE

## 2016-11-15 MED ORDER — LIDOCAINE HCL (PF) 1 % IJ SOLN
INTRAMUSCULAR | Status: AC
  Filled 2016-11-15: qty 30

## 2016-11-15 MED ORDER — LIDOCAINE HCL (PF) 1 % IJ SOLN
30.0000 mL | Freq: Once | INTRAMUSCULAR | Status: AC
  Administered 2016-11-15: 30 mL
  Filled 2016-11-15: qty 30

## 2016-11-15 NOTE — Progress Notes (Addendum)
PROGRESS NOTE    GIDEON BURSTEIN  KPT:[id number] DOB: 11-29-1965 DOA: 11/14/2016 PCP: Asencion Noble, MD     Brief Narrative:  Derrick Duncan is a 51 y.o. male with history of diabetes mellitus type 2, hypertension, hyperlipidemia has been experiencing persistent fever chills joint swelling over the last 2 weeks. Patient's symptoms started on 03/20. Patient had fever chills and bilateral knee joint pains. Denies any shortness of breath or productive cough at that time. Patient had called his PCP and since patient was unable to go to the clinic Tamiflu was called in. After taking Tamiflu patient started developing diarrhea and increasing joint swelling of the knees. Since the symptoms persisted patient came to the ER on 3/29 and had arthrocentesis of his left knee. Arthrocentesis fluid showed WBC of 6400. Up until now there has been no growth. Patient also has been having persistent diarrhea. Denies any sick contacts or recent travel. Patient also states over the last 24 hours has been having increasing weakness of the right upper and lower extremity due to pain involving the right shoulder and right knee.  Subjective: He denies any previous history of arthritis or joint pains. No recent illnesses. He denies any dry eyes, dry mouth, mouth or nose ulcers, rash, chest pain, shortness of breath, cough, nausea or vomiting, abdominal pain. He has had loose diarrhea for the past week which is watery in nature, denies any melena or hematochezia. He has no family history of rheumatologic disorders, denies any personal history of Crohn's or ulcerative colitis, IBD. He did have a colonoscopy years ago which was unremarkable. He denies any history of low back pain or stiffness. He has no other joint pains or stiffness. Prior to this, he was very active. His last trip was [place] in October. No sick contacts. He has right arm weakness that started 2 days ago, but not bilateral shoulder weakness or stiffness.    Assessment & Plan:   Active Problems:   HLD (hyperlipidemia)   Arthritis   Diabetes mellitus type 2, controlled ([place])   Fever   Diarrhea   Essential hypertension  Fever with arthritis -Unclear etiology, ddx includes rheumatologic vs vasculitic vs viral vs other  -Left knee arthrocentesis 3/29 with 6400 WBC, no crystals, no growth  -Sed rate, CRP, ferritin are elevated consistent with inflammatory state  -HIV negative, RPR non-reactive  -Blood cultures pending  -Urine culture pending -RMSF IgG and IgM pending  -B burgdorfi Ab pending  -GC/Chlamydia pending  -Parvo B19 Ab pending  -RF, ANA, Anti-DNA Ab, Anti-CCP, HLA B27 pending  -Check hepatitis, TSH  -Spoke with ID today. Will ask Ortho to consult for right knee arthrocentesis   Right arm weakness -Unclear if this is related to bilateral knee swelling. MRI cervical spine with right C7 neural impingement. Will eventually need neurosurgical evaluation   Diarrhea -C diff negative -Stool PCR pending  DM type 2 -SSI, hold metformin   HTN -Lisinopril  HLD -Statin    GERD -PPI    DVT prophylaxis: SCDs Code Status: Full Family Communication: wife over the phone, daughter at bedside Disposition Plan: pending work up   Consultants:   Infectious Disease  Procedures:   None  Antimicrobials:   None     Objective: Vitals:   11/14/16 1900 11/14/16 1915 11/14/16 2123 11/15/16 0500  BP:  (!) 135/99 (!) 146/92 (!) 148/94  Pulse: 72 68 77 67  Resp:   16 16  Temp:    98.3 F (36.8 C)  TempSrc:  Oral Oral  SpO2: 97% 96% 99% 98%    Intake/Output Summary (Last 24 hours) at 11/15/16 1420 Last data filed at 11/15/16 1211  Gross per 24 hour  Intake              120 ml  Output              600 ml  Net             -480 ml   There were no vitals filed for this visit.  Examination:  General exam: Appears calm and comfortable  Respiratory system: Clear to auscultation. Respiratory effort  normal. Cardiovascular system: S1 & S2 heard, RRR. No JVD, murmurs, rubs, gallops or clicks. No pedal edema. Gastrointestinal system: Abdomen is nondistended, soft and nontender. No organomegaly or masses felt. Normal bowel sounds heard. Central nervous system: Alert and oriented. No focal neurological deficits. Extremities: right knee with effusion, no erythema, +slight warmth, left knee with less degree of effusion, no erythema. ROM limited. Gait is slow due to stiffness and pain, using a walker  Skin: No rashes, lesions or ulcers Psychiatry: Judgement and insight appear normal. Mood & affect appropriate.   Data Reviewed: I have personally reviewed following labs and imaging studies  CBC:  Recent Labs Lab 11/11/16 1239 11/14/16 1424 11/15/16 0549  WBC 14.1* 10.0 8.7  NEUTROABS 10.3* 6.9  --   HGB 12.0* 10.6* 10.5*  HCT 36.2* 31.8* 32.8*  MCV 90.0 89.8 91.4  PLT 326 378 342   Basic Metabolic Panel:  Recent Labs Lab 11/11/16 1239 11/14/16 1424 11/15/16 0549  NA 139 139 141  K 4.1 3.8 4.1  CL 103 108 107  CO2 _0 GLUCOSE 118* 138* 100*  BUN _1 CREATININE 0.90 0.86 0.75  CALCIUM 9.3 9.0 9.1   GFR: Estimated Creatinine Clearance: 148 mL/min (by C-G formula based on SCr of 0.75 mg/dL). Liver Function Tests:  Recent Labs Lab 11/11/16 1239 11/14/16 1424  AST 24 27  ALT 40 48  ALKPHOS 74 82  BILITOT 1.6* 0.5  PROT 6.9 6.6  ALBUMIN 3.2* 3.0*   No results for input(s): LIPASE, AMYLASE in the last 168 hours. No results for input(s): AMMONIA in the last 168 hours. Coagulation Profile: No results for input(s): INR, PROTIME in the last 168 hours. Cardiac Enzymes:  Recent Labs Lab 11/14/16 2206  CKTOTAL 86   BNP (last 3 results) No results for input(s): PROBNP in the last 8760 hours. HbA1C: No results for input(s): HGBA1C in the last 72 hours. CBG:  Recent Labs Lab 11/14/16 2116 11/15/16 1154  GLUCAP 137* 126*   Lipid Profile: No results  for input(s): CHOL, HDL, LDLCALC, TRIG, CHOLHDL, LDLDIRECT in the last 72 hours. Thyroid Function Tests: No results for input(s): TSH, T4TOTAL, FREET4, T3FREE, THYROIDAB in the last 72 hours. Anemia Panel:  Recent Labs  11/15/16 1159  VITAMINB12 398  FOLATE 10.8  FERRITIN 601*  TIBC 272  IRON 21*  RETICCTPCT 1.2   Sepsis Labs:  Recent Labs Lab 11/11/16 1256 11/14/16 1456 11/14/16 1805 11/14/16 2206  LATICACIDVEN 0.76 1.31 0.71 0.9    Recent Results (from the past 240 hour(s))  Body fluid culture     Status: None   Collection Time: 11/11/16  2:37 PM  Result Value Ref Range Status   Specimen Description SYNOVIAL KNEE  Final   Special Requests NONE  Final   Gram Stain   Final    ABUNDANT WBC PRESENT,BOTH PMN  AND MONONUCLEAR NO ORGANISMS SEEN    Culture NO GROWTH 3 DAYS  Final   Report Status 11/15/2016 FINAL  Final  Blood culture (routine x 2)     Status: None (Preliminary result)   Collection Time: 11/11/16  3:20 PM  Result Value Ref Range Status   Specimen Description BLOOD RIGHT ANTECUBITAL  Final   Special Requests BOTTLES DRAWN AEROBIC AND ANAEROBIC 5CC  Final   Culture NO GROWTH 4 DAYS  Final   Report Status PENDING  Incomplete  Blood culture (routine x 2)     Status: None (Preliminary result)   Collection Time: 11/11/16  3:25 PM  Result Value Ref Range Status   Specimen Description BLOOD RIGHT HAND  Final   Special Requests BOTTLES DRAWN AEROBIC AND ANAEROBIC 5CC  Final   Culture NO GROWTH 4 DAYS  Final   Report Status PENDING  Incomplete  Culture, blood (routine x 2)     Status: None (Preliminary result)   Collection Time: 11/14/16  5:56 PM  Result Value Ref Range Status   Specimen Description BLOOD RIGHT ANTECUBITAL  Final   Special Requests   Final    BOTTLES DRAWN AEROBIC AND ANAEROBIC Blood Culture adequate volume   Culture NO GROWTH < 24 HOURS  Final   Report Status PENDING  Incomplete  Culture, blood (routine x 2)     Status: None (Preliminary  result)   Collection Time: 11/14/16  9:49 PM  Result Value Ref Range Status   Specimen Description BLOOD LEFT ARM  Final   Special Requests IN PEDIATRIC BOTTLE Blood Culture adequate volume  Final   Culture NO GROWTH < 24 HOURS  Final   Report Status PENDING  Incomplete  C difficile quick scan w PCR reflex     Status: None   Collection Time: 11/15/16 10:15 AM  Result Value Ref Range Status   C Diff antigen NEGATIVE NEGATIVE Final   C Diff toxin NEGATIVE NEGATIVE Final   C Diff interpretation No C. difficile detected.  Final       Radiology Studies: Dg Chest 2 View  Result Date: 11/14/2016 CLINICAL DATA:  Fever EXAM: CHEST  2 VIEW COMPARISON:  11/11/2016 chest radiograph. FINDINGS: Stable cardiomediastinal silhouette with normal heart size. No pneumothorax. No pleural effusion. Lungs appear clear, with no acute consolidative airspace disease and no pulmonary edema. IMPRESSION: No active cardiopulmonary disease. Electronically Signed   By: Ilona Sorrel M.D.   On: 11/14/2016 17:48   Ct Head Wo Contrast  Result Date: 11/14/2016 CLINICAL DATA:  Initial evaluation for acute headache, right-sided weakness. EXAM: CT HEAD WITHOUT CONTRAST TECHNIQUE: Contiguous axial images were obtained from the base of the skull through the vertex without intravenous contrast. COMPARISON:  Prior head CT from 01/05/2013. 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 Cerebral volume otherwise normal. No acute intracranial hemorrhage. No evidence for acute large vessel territory infarct. No mass lesion, midline shift or mass effect. No hydrocephalus. No extra-axial fluid collection. Vascular: No hyperdense vessel. Skull: Scalp soft tissues within normal limits.  Calvarium intact. Sinuses/Orbits: Globes and orbital soft tissues within normal limits. Mild scattered mucosal thickening within the ethmoidal air cells. Paranasal sinuses  are otherwise clear. No mastoid effusion. Other: None. IMPRESSION: 1. No acute intracranial process. 2. 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 3. Otherwise negative head CT. Electronically Signed   By: Pincus Badder.D.  On: 11/14/2016 18:45   Mr Brain Wo Contrast  Result Date: 11/15/2016 CLINICAL DATA:  Joint pain and headaches. RIGHT arm weakness. History of [facility] spotted fever. EXAM: MRI HEAD WITHOUT CONTRAST MRI CERVICAL SPINE WITHOUT CONTRAST TECHNIQUE: Multiplanar, multiecho pulse sequences of the brain and surrounding structures, and cervical spine, to include the craniocervical junction and cervicothoracic junction, were obtained without intravenous contrast. COMPARISON:  CT head 11/14/2016.  CT head 01/05/2013. FINDINGS: MRI HEAD FINDINGS Brain: No evidence for acute infarction, hemorrhage, mass lesion, hydrocephalus, or extra-axial fluid. Normal for age cerebral volume, except for LEFT temporal encephalomalacia. Small craniectomy defect, as well as chronic blood products in this region, sequelae of treatment for what was reportedly a subdural hematoma. Minor white matter disease remote from the area of LEFT temporal brain injury, nonspecific. Vascular: Normal flow voids. Skull and upper cervical spine: Normal marrow signal. Sinuses/Orbits: Negative. Other: None. MRI CERVICAL SPINE FINDINGS Alignment: Reversal of the normal cervical lordotic curve. No subluxation. Vertebrae: No worrisome osseous lesion. Cord: Cord compression without abnormal cord signal at C3-4, C5-6, and C6-7. Posterior Fossa, vertebral arteries, paraspinal tissues: Unremarkable. Disc levels: C2-3:  Normal. C3-4: Central and leftward protrusion/disc osteophyte complex with LEFT-sided uncinate spurring. LEFT-sided cord flattening is prominent. Canal diameter 6-7 mm. LEFT C4 foraminal narrowing. C4-5: Annular bulge. RIGHT-sided uncinate spurring.  RIGHT C5 foraminal narrowing. C5-6: Central and leftward protrusion/ disc osteophyte complex. Mild facet arthropathy. LEFT-sided cord flattening. Canal diameter 6-7 mm. LEFT C6 foraminal narrowing. C6-7: Central and rightward disc extrusion. Caudally migrated free fragment. Central and RIGHT-sided cord flattening. Canal diameter 6-7 mm. RIGHT greater than LEFT C7 foraminal narrowing. C7-T1:  Unremarkable. 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 No acute intracranial findings. No findings suggestive of rickettsial disease Motion degraded MRI cervical spine demonstrates a central and rightward disc extrusion at C6-7, caudally migrated free fragment. Correlate clinically for RIGHT C7 neural impingement contributing to RIGHT arm weakness. Cord flattening without abnormal cord signal at multiple levels. Electronically Signed   By: Staci Righter M.D.   On: 11/15/2016 09:23   Mr Cervical Spine Wo Contrast  Result Date: 11/15/2016 CLINICAL DATA:  Joint pain and headaches. RIGHT arm weakness. History of [facility] spotted fever. EXAM: MRI HEAD WITHOUT CONTRAST MRI CERVICAL SPINE WITHOUT CONTRAST TECHNIQUE: Multiplanar, multiecho pulse sequences of the brain and surrounding structures, and cervical spine, to include the craniocervical junction and cervicothoracic junction, were obtained without intravenous contrast. COMPARISON:  CT head 11/14/2016.  CT head 01/05/2013. FINDINGS: MRI HEAD FINDINGS Brain: No evidence for acute infarction, hemorrhage, mass lesion, hydrocephalus, or extra-axial fluid. Normal for age cerebral volume, except for LEFT temporal encephalomalacia. Small craniectomy defect, as well as chronic blood products in this region, sequelae of treatment for what was reportedly a subdural hematoma. Minor white matter disease remote from the area of LEFT temporal brain injury, nonspecific. Vascular:  Normal flow voids. Skull and upper cervical spine: Normal marrow signal. Sinuses/Orbits: Negative. Other: None. MRI CERVICAL SPINE FINDINGS Alignment: Reversal of the normal cervical lordotic curve. No subluxation. Vertebrae: No worrisome osseous lesion. Cord: Cord compression without abnormal cord signal at C3-4, C5-6, and C6-7. Posterior Fossa, vertebral arteries, paraspinal tissues: Unremarkable. Disc levels: C2-3:  Normal. C3-4: Central and leftward protrusion/disc osteophyte complex with LEFT-sided uncinate spurring. LEFT-sided cord flattening is prominent. Canal diameter 6-7 mm. LEFT C4 foraminal narrowing. C4-5: Annular bulge. RIGHT-sided uncinate spurring. RIGHT C5 foraminal narrowing. C5-6: Central and leftward  protrusion/ disc osteophyte complex. Mild facet arthropathy. LEFT-sided cord flattening. Canal diameter 6-7 mm. LEFT C6 foraminal narrowing. C6-7: Central and rightward disc extrusion. Caudally migrated free fragment. Central and RIGHT-sided cord flattening. Canal diameter 6-7 mm. RIGHT greater than LEFT C7 foraminal narrowing. C7-T1:  Unremarkable. 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 No acute intracranial findings. No findings suggestive of rickettsial disease Motion degraded MRI cervical spine demonstrates a central and rightward disc extrusion at C6-7, caudally migrated free fragment. Correlate clinically for RIGHT C7 neural impingement contributing to RIGHT arm weakness. Cord flattening without abnormal cord signal at multiple levels. Electronically Signed   By: Staci Righter M.D.   On: 11/15/2016 09:23      Scheduled Meds: . insulin aspart  0-9 Units Subcutaneous TID WC  . lisinopril  40 mg Oral Daily  . pantoprazole  40 mg Oral Daily  . simvastatin  40 mg Oral q morning - 10a   Continuous Infusions:    LOS: 0 days    Time spent: 50 minutes   Dessa Phi, DO [facility] [email or web address] Password [email or web address] 11/15/2016, 2:20 PM

## 2016-11-15 NOTE — Consult Note (Signed)
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 Maylene Roes  HPI: Derrick Duncan is an 51 y.o. male past mental history significant for diabetes mellitus hypertension hyperlipidemia, [place] spotted fever, history of craniotomy for subdural hematoma who developed a "flulike illness on March 20. He was having fever chills and achy joints. He was I able to be seen by primary care physician and testing was not performed for what was thought to be influenza. He was given a 5 day course of Tamiflu. Afterwards he developed loose stools and began complaining of severe pain in his knees is made it very difficult for him to even walk. He was apparently started on Augmentin by his primary care physician and then went to the emerge department on March the 29th. An aspirate of the left knee was obtained which was positive for 6400 white  blood cells with 46% neutrophils and 33% lymphocytes. There were no crystals seen and cultures on Augmentin were negative he was given a dose of ceftriaxone intramuscular volume the ER. Since aspiration of the left knee joint he has had greater mobility in that joint but still worsening ability to bend his right knee repair we with it. He also has developed severe shoulder pain and now has difficulty with abduction and adduction and elevation of his shoulder joint.     Past Medical History:  Diagnosis Date  . Diabetes mellitus without complication ([place])   . Hypercholesteremia   . Hypertension   . [place] spotted fever     Past Surgical History:  Procedure Laterality Date  . CRANIOTOMY     subdural hematoma     Social History:  reports that he has never smoked. He has quit using smokeless tobacco. He reports that he does not drink alcohol or use drugs.   Family History  Problem Relation Age of Onset  . Diabetes Father     No  Known Allergies   Medications: I have reviewed patients current medications as documented in Epic Anti-infectives    None         ROS:as in HPI otherwise remainder of 12 point Review of Systems is negative   Blood pressure (!) 146/93, pulse 84, temperature 99.4 F (37.4 C), temperature source Oral, resp. rate 17, SpO2 98 %. General: Alert and awake, oriented x3, not in any acute distress. HEENT: anicteric sclera,  EOMI, oropharynx clear and without exudate Cardiovascular: regular rate, normal r,  no murmur rubs or gallops Pulmonary: clear to auscultation bilaterally, no wheezing, rales or rhonchi Gastrointestinal: soft nontender, nondistended, normal bowel sounds, Musculoskeletal his shoulder is visibly tender to palpation in his range of motion is limited right knee with an effusion and significant limitation in range of motion and tenderness to palpation on exam. Left knee with less of an effusion and greater range of motion though not full range of motion and also with tenderness about the joint. Skin, soft tissue: no rashes Neuro: nonfocal, strength and sensation intact   Results for orders placed or performed during the hospital encounter of 11/14/16 (from the past 48 hour(s))  CBC with Differential     Status: Abnormal   Collection Time: 11/14/16  2:24 PM  Result Value Ref Range   WBC 10.0 4.0 - 10.5 K/uL   RBC 3.54 (L) 4.22 - 5.81 MIL/uL   Hemoglobin 10.6 (L) 13.0 - 17.0 g/dL  HCT 31.8 (L) 39.0 - 52.0 %   MCV 89.8 78.0 - 100.0 fL   MCH 29.9 26.0 - 34.0 pg   MCHC 33.3 30.0 - 36.0 g/dL   RDW 14.0 11.5 - 15.5 %   Platelets 378 150 - 400 K/uL   Neutrophils Relative % 69 %   Lymphocytes Relative 21 %   Monocytes Relative 9 %   Eosinophils Relative 1 %   Basophils Relative 0 %   Neutro Abs 6.9 1.7 - 7.7 K/uL   Lymphs Abs 2.1 0.7 - 4.0 K/uL   Monocytes Absolute 0.9 0.1 - 1.0 K/uL   Eosinophils Absolute 0.1 0.0 - 0.7 K/uL   Basophils Absolute 0.0 0.0 - 0.1 K/uL   RBC  Morphology TARGET CELLS   Comprehensive metabolic panel     Status: Abnormal   Collection Time: 11/14/16  2:24 PM  Result Value Ref Range   Sodium 139 135 - 145 mmol/L   Potassium 3.8 3.5 - 5.1 mmol/L   Chloride 108 101 - 111 mmol/L   CO2 25 22 - 32 mmol/L   Glucose, Bld 138 (H) 65 - 99 mg/dL   BUN 17 6 - 20 mg/dL   Creatinine, Ser 0.86 0.61 - 1.24 mg/dL   Calcium 9.0 8.9 - 10.3 mg/dL   Total Protein 6.6 6.5 - 8.1 g/dL   Albumin 3.0 (L) 3.5 - 5.0 g/dL   AST 27 15 - 41 U/L   ALT 48 17 - 63 U/L   Alkaline Phosphatase 82 38 - 126 U/L   Total Bilirubin 0.5 0.3 - 1.2 mg/dL   GFR calc non Af Amer >60 >60 mL/min   GFR calc Af Amer >60 >60 mL/min    Comment: (NOTE) The eGFR has been calculated using the CKD EPI equation. This calculation has not been validated in all clinical situations. eGFR's persistently <60 mL/min signify possible Chronic Kidney Disease.    Anion gap 6 5 - 15  Sedimentation rate     Status: Abnormal   Collection Time: 11/14/16  2:26 PM  Result Value Ref Range   Sed Rate 114 (H) 0 - 16 mm/hr  C-reactive protein     Status: Abnormal   Collection Time: 11/14/16  2:44 PM  Result Value Ref Range   CRP 12.5 (H) <1.0 mg/dL  I-Stat CG4 Lactic Acid, ED     Status: None   Collection Time: 11/14/16  2:56 PM  Result Value Ref Range   Lactic Acid, Venous 1.31 0.5 - 1.9 mmol/L  Urinalysis, Routine w reflex microscopic     Status: Abnormal   Collection Time: 11/14/16  4:25 PM  Result Value Ref Range   Color, Urine YELLOW YELLOW   APPearance CLEAR CLEAR   Specific Gravity, Urine 1.030 1.005 - 1.030   pH 5.0 5.0 - 8.0   Glucose, UA 50 (A) NEGATIVE mg/dL   Hgb urine dipstick NEGATIVE NEGATIVE   Bilirubin Urine NEGATIVE NEGATIVE   Ketones, ur NEGATIVE NEGATIVE mg/dL   Protein, ur NEGATIVE NEGATIVE mg/dL   Nitrite NEGATIVE NEGATIVE   Leukocytes, UA NEGATIVE NEGATIVE  Urine culture     Status: None   Collection Time: 11/14/16  4:25 PM  Result Value Ref Range   Specimen  Description URINE, RANDOM    Special Requests NONE    Culture NO GROWTH    Report Status 11/15/2016 FINAL   RPR     Status: None   Collection Time: 11/14/16  5:49 PM  Result Value Ref Range  RPR Ser Ql Non Reactive Non Reactive    Comment: (NOTE) Performed At: [facility] [place], [place] [id number] Lindon Romp MD IH:[id number]   Culture, blood (routine x 2)     Status: None (Preliminary result)   Collection Time: 11/14/16  5:56 PM  Result Value Ref Range   Specimen Description BLOOD RIGHT ANTECUBITAL    Special Requests      BOTTLES DRAWN AEROBIC AND ANAEROBIC Blood Culture adequate volume   Culture NO GROWTH < 24 HOURS    Report Status PENDING   I-Stat CG4 Lactic Acid, ED     Status: None   Collection Time: 11/14/16  6:05 PM  Result Value Ref Range   Lactic Acid, Venous 0.71 0.5 - 1.9 mmol/L  Glucose, capillary     Status: Abnormal   Collection Time: 11/14/16  9:16 PM  Result Value Ref Range   Glucose-Capillary 137 (H) 65 - 99 mg/dL  Culture, blood (routine x 2)     Status: None (Preliminary result)   Collection Time: 11/14/16  9:49 PM  Result Value Ref Range   Specimen Description BLOOD LEFT ARM    Special Requests IN PEDIATRIC BOTTLE Blood Culture adequate volume    Culture NO GROWTH < 24 HOURS    Report Status PENDING   Uric acid     Status: Abnormal   Collection Time: 11/14/16  9:49 PM  Result Value Ref Range   Uric Acid, Serum 3.9 (L) 4.4 - 7.6 mg/dL  HIV antibody     Status: None   Collection Time: 11/14/16  9:49 PM  Result Value Ref Range   HIV Screen 4th Generation wRfx Non Reactive Non Reactive    Comment: (NOTE) Performed At: [facility] [address] [place], [place] [id number] Lindon Romp MD [id number]   Lactic acid, plasma     Status: None   Collection Time: 11/14/16 10:06 PM  Result Value Ref Range   Lactic Acid, Venous 0.9 0.5 - 1.9 mmol/L  CK     Status: None   Collection Time: 11/14/16 10:06 PM    Result Value Ref Range   Total CK 86 49 - 397 U/L  Lactate dehydrogenase     Status: None   Collection Time: 11/14/16 10:06 PM  Result Value Ref Range   LDH 150 98 - 192 U/L  Pathologist smear review     Status: None   Collection Time: 11/14/16 10:06 PM  Result Value Ref Range   Path Review Few atypical lymphocytes     Comment: Reviewed by Chrystie Nose. Saralyn Pilar, M.D. 6/0/10   Basic metabolic panel     Status: Abnormal   Collection Time: 11/15/16  5:49 AM  Result Value Ref Range   Sodium 141 135 - 145 mmol/L   Potassium 4.1 3.5 - 5.1 mmol/L   Chloride 107 101 - 111 mmol/L   CO2 27 22 - 32 mmol/L   Glucose, Bld 100 (H) 65 - 99 mg/dL   BUN 13 6 - 20 mg/dL   Creatinine, Ser 0.75 0.61 - 1.24 mg/dL   Calcium 9.1 8.9 - 10.3 mg/dL   GFR calc non Af Amer >60 >60 mL/min   GFR calc Af Amer >60 >60 mL/min    Comment: (NOTE) The eGFR has been calculated using the CKD EPI equation. This calculation has not been validated in all clinical situations. eGFR's persistently <60 mL/min signify possible Chronic Kidney Disease.    Anion gap 7 5 - 15  CBC     Status: Abnormal   Collection Time: 11/15/16  5:49 AM  Result Value Ref Range   WBC 8.7 4.0 - 10.5 K/uL   RBC 3.59 (L) 4.22 - 5.81 MIL/uL   Hemoglobin 10.5 (L) 13.0 - 17.0 g/dL   HCT 32.8 (L) 39.0 - 52.0 %   MCV 91.4 78.0 - 100.0 fL   MCH 29.2 26.0 - 34.0 pg   MCHC 32.0 30.0 - 36.0 g/dL   RDW 14.0 11.5 - 15.5 %   Platelets 386 150 - 400 K/uL  Gastrointestinal Panel by PCR , Stool     Status: None   Collection Time: 11/15/16 10:15 AM  Result Value Ref Range   Campylobacter species NOT DETECTED NOT DETECTED   Plesimonas shigelloides NOT DETECTED NOT DETECTED   Salmonella species NOT DETECTED NOT DETECTED   Yersinia enterocolitica NOT DETECTED NOT DETECTED   Vibrio species NOT DETECTED NOT DETECTED   Vibrio cholerae NOT DETECTED NOT DETECTED   Enteroaggregative E coli (EAEC) NOT DETECTED NOT DETECTED   Enteropathogenic E coli (EPEC) NOT  DETECTED NOT DETECTED   Enterotoxigenic E coli (ETEC) NOT DETECTED NOT DETECTED   Shiga like toxin producing E coli (STEC) NOT DETECTED NOT DETECTED   Shigella/Enteroinvasive E coli (EIEC) NOT DETECTED NOT DETECTED   Cryptosporidium NOT DETECTED NOT DETECTED   Cyclospora cayetanensis NOT DETECTED NOT DETECTED   Entamoeba histolytica NOT DETECTED NOT DETECTED   Giardia lamblia NOT DETECTED NOT DETECTED   Adenovirus F40/41 NOT DETECTED NOT DETECTED   Astrovirus NOT DETECTED NOT DETECTED   Norovirus GI/GII NOT DETECTED NOT DETECTED   Rotavirus A NOT DETECTED NOT DETECTED   Sapovirus (I, II, IV, and V) NOT DETECTED NOT DETECTED  C difficile quick scan w PCR reflex     Status: None   Collection Time: 11/15/16 10:15 AM  Result Value Ref Range   C Diff antigen NEGATIVE NEGATIVE   C Diff toxin NEGATIVE NEGATIVE   C Diff interpretation No C. difficile detected.   Glucose, capillary     Status: Abnormal   Collection Time: 11/15/16 11:54 AM  Result Value Ref Range   Glucose-Capillary 126 (H) 65 - 99 mg/dL  Vitamin B12     Status: None   Collection Time: 11/15/16 11:59 AM  Result Value Ref Range   Vitamin B-12 398 180 - 914 pg/mL    Comment: (NOTE) This assay is not validated for testing neonatal or myeloproliferative syndrome specimens for Vitamin B12 levels.   Folate     Status: None   Collection Time: 11/15/16 11:59 AM  Result Value Ref Range   Folate 10.8 >5.9 ng/mL  Iron and TIBC     Status: Abnormal   Collection Time: 11/15/16 11:59 AM  Result Value Ref Range   Iron 21 (L) 45 - 182 ug/dL   TIBC 272 250 - 450 ug/dL   Saturation Ratios 8 (L) 17.9 - 39.5 %   UIBC 251 ug/dL  Ferritin     Status: Abnormal   Collection Time: 11/15/16 11:59 AM  Result Value Ref Range   Ferritin 601 (H) 24 - 336 ng/mL  Reticulocytes     Status: Abnormal   Collection Time: 11/15/16 11:59 AM  Result Value Ref Range   Retic Ct Pct 1.2 0.4 - 3.1 %   RBC. 3.57 (L) 4.22 - 5.81 MIL/uL   Retic Count,  Manual 42.8 19.0 - 186.0 K/uL  TSH     Status: None   Collection Time: 11/15/16  2:26 PM  Result Value Ref Range   TSH 0.621 0.350 - 4.500 uIU/mL    Comment: Performed by a 3rd Generation assay with a functional sensitivity of <=0.01 uIU/mL.  Glucose, capillary     Status: Abnormal   Collection Time: 11/15/16  4:37 PM  Result Value Ref Range   Glucose-Capillary 105 (H) 65 - 99 mg/dL   @BRIEFLABTABLE (sdes,specrequest,cult,reptstatus)   ) Recent Results (from the past 720 hour(s))  Body fluid culture     Status: None   Collection Time: 11/11/16  2:37 PM  Result Value Ref Range Status   Specimen Description SYNOVIAL KNEE  Final   Special Requests NONE  Final   Gram Stain   Final    ABUNDANT WBC PRESENT,BOTH PMN AND MONONUCLEAR NO ORGANISMS SEEN    Culture NO GROWTH 3 DAYS  Final   Report Status 11/15/2016 FINAL  Final  Blood culture (routine x 2)     Status: None (Preliminary result)   Collection Time: 11/11/16  3:20 PM  Result Value Ref Range Status   Specimen Description BLOOD RIGHT ANTECUBITAL  Final   Special Requests BOTTLES DRAWN AEROBIC AND ANAEROBIC 5CC  Final   Culture NO GROWTH 4 DAYS  Final   Report Status PENDING  Incomplete  Blood culture (routine x 2)     Status: None (Preliminary result)   Collection Time: 11/11/16  3:25 PM  Result Value Ref Range Status   Specimen Description BLOOD RIGHT HAND  Final   Special Requests BOTTLES DRAWN AEROBIC AND ANAEROBIC 5CC  Final   Culture NO GROWTH 4 DAYS  Final   Report Status PENDING  Incomplete  Urine culture     Status: None   Collection Time: 11/14/16  4:25 PM  Result Value Ref Range Status   Specimen Description URINE, RANDOM  Final   Special Requests NONE  Final   Culture NO GROWTH  Final   Report Status 11/15/2016 FINAL  Final  Culture, blood (routine x 2)     Status: None (Preliminary result)   Collection Time: 11/14/16  5:56 PM  Result Value Ref Range Status   Specimen Description BLOOD RIGHT ANTECUBITAL   Final   Special Requests   Final    BOTTLES DRAWN AEROBIC AND ANAEROBIC Blood Culture adequate volume   Culture NO GROWTH < 24 HOURS  Final   Report Status PENDING  Incomplete  Culture, blood (routine x 2)     Status: None (Preliminary result)   Collection Time: 11/14/16  9:49 PM  Result Value Ref Range Status   Specimen Description BLOOD LEFT ARM  Final   Special Requests IN PEDIATRIC BOTTLE Blood Culture adequate volume  Final   Culture NO GROWTH < 24 HOURS  Final   Report Status PENDING  Incomplete  Gastrointestinal Panel by PCR , Stool     Status: None   Collection Time: 11/15/16 10:15 AM  Result Value Ref Range Status   Campylobacter species NOT DETECTED NOT DETECTED Final   Plesimonas shigelloides NOT DETECTED NOT DETECTED Final   Salmonella species NOT DETECTED NOT DETECTED Final   Yersinia enterocolitica NOT DETECTED NOT DETECTED Final   Vibrio species NOT DETECTED NOT DETECTED Final   Vibrio cholerae NOT DETECTED NOT DETECTED Final   Enteroaggregative E coli (EAEC) NOT DETECTED NOT DETECTED Final   Enteropathogenic E coli (EPEC) NOT DETECTED NOT DETECTED Final   Enterotoxigenic E coli (ETEC) NOT DETECTED NOT DETECTED Final   Shiga like toxin producing E coli (STEC) NOT DETECTED  NOT DETECTED Final   Shigella/Enteroinvasive E coli (EIEC) NOT DETECTED NOT DETECTED Final   Cryptosporidium NOT DETECTED NOT DETECTED Final   Cyclospora cayetanensis NOT DETECTED NOT DETECTED Final   Entamoeba histolytica NOT DETECTED NOT DETECTED Final   Giardia lamblia NOT DETECTED NOT DETECTED Final   Adenovirus F40/41 NOT DETECTED NOT DETECTED Final   Astrovirus NOT DETECTED NOT DETECTED Final   Norovirus GI/GII NOT DETECTED NOT DETECTED Final   Rotavirus A NOT DETECTED NOT DETECTED Final   Sapovirus (I, II, IV, and V) NOT DETECTED NOT DETECTED Final  C difficile quick scan w PCR reflex     Status: None   Collection Time: 11/15/16 10:15 AM  Result Value Ref Range Status   C Diff antigen  NEGATIVE NEGATIVE Final   C Diff toxin NEGATIVE NEGATIVE Final   C Diff interpretation No C. difficile detected.  Final     Impression/Recommendation  Active Problems:   HLD (hyperlipidemia)   Arthritis   Diabetes mellitus type 2, controlled ([place])   Fever   Diarrhea   Essential hypertension   WANG GRANADA is a 52 y.o. 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  #1 Inflammatory arthritis:  I think the most important maneuver that can be done is to ask Orthopedics or IR for repeat aspirate of the joint--but this time perform on the RIGHT knee and send the fluid for  Cell count and differential Crystals  Bacterial cultures Fungal cultures AFB cultures  I will check him for GC and chlamydia but urine, oropharynx and rectal swabs (though he states he has been in monogamous relationship with his wife for several decades) Note this hx was taken with daughter in the room since pt wanted to answer all questions in front of her.  His HIV is negative but I will add an HIV RNA quant Hepatitis panel is being sent. I will check CMV quant PCR, and EBV panel, Parvovirus serologies have been sent. As has a GI pathogen panel. (enterovirus could cause polyarthritis)  He is also having labs sent for various autoimmune causes of inflammatory arthritis  If repeat no feel fluid analysis is not showing evidence for bacterial septic arthritis and fungal and AFB stains are negative and he is negative for gonorrhea and chlamydia I would be in favor of an empiric trial of corticosteroids given systemically to the patient to see if it improves his symptoms.         11/15/2016, 6:33 PM   Thank you so much for this interesting consult  [place] [facility] [place] [phone] (pager) [phone] (office) 11/15/2016, 6:33 PM  Rhina [place] 11/15/2016, 6:33 PM

## 2016-11-15 NOTE — Progress Notes (Signed)
[facility] Infusion Coordinator is following [name] with ID team to support outpatient IV ABX as/if needed at DC to home.  If patient discharges after hours, please call [phone].   Derrick Duncan 11/15/2016, 11:01 AM

## 2016-11-15 NOTE — Consult Note (Signed)
     Subjective:  Dr. Charlann Boxer was consulted for concern of infection in the right knee. Left knee has already been aspirated, but concern has been raised for the right knee.    Objective:   VITALS:   Vitals:   11/15/16 0500 11/15/16 1639  BP: (!) 148/94 (!) 146/93  Pulse: 67 84  Resp: 16 17  Temp: 98.3 F (36.8 C) 99.4 F (37.4 C)    Neurovascular intact No cellulitis present Moderate effusion right knee  LABS  Recent Labs  11/14/16 1424 11/15/16 0549  HGB 10.6* 10.5*  HCT 31.8* 32.8*  WBC 10.0 8.7  PLT 378 386     Recent Labs  11/14/16 1424 11/15/16 0549  NA 139 141  K 3.8 4.1  BUN 17 13  CREATININE 0.86 0.75  GLUCOSE 138* 100*     Assessment/Plan: Right knee pain and swelling  Right knee was aspirated under sterile technique of 54 cc of slightly cloudy synovial fluid A piece of cartilage/meniscus also appears to have been aspirated with the fluid.   The fluid was sent to the lab for cell count, gram stain, crystals as well as bacterial, fungal and AFB cultures. We will follow the labs and proceed as indicated by the results.Derrick Duncan   PAC  11/15/2016, 9:03 PM   Agree with above plan.

## 2016-11-16 ENCOUNTER — Encounter (HOSPITAL_COMMUNITY): Payer: Self-pay | Admitting: General Practice

## 2016-11-16 DIAGNOSIS — Z9889 Other specified postprocedural states: Secondary | ICD-10-CM

## 2016-11-16 DIAGNOSIS — M130 Polyarthritis, unspecified: Secondary | ICD-10-CM

## 2016-11-16 DIAGNOSIS — R5383 Other fatigue: Secondary | ICD-10-CM | POA: Diagnosis present

## 2016-11-16 LAB — CBC
HCT: 33.7 % — ABNORMAL LOW (ref 39.0–52.0)
Hemoglobin: 11 g/dL — ABNORMAL LOW (ref 13.0–17.0)
MCH: 30.1 pg (ref 26.0–34.0)
MCHC: 32.6 g/dL (ref 30.0–36.0)
MCV: 92.1 fL (ref 78.0–100.0)
PLATELETS: 438 10*3/uL — AB (ref 150–400)
RBC: 3.66 MIL/uL — ABNORMAL LOW (ref 4.22–5.81)
RDW: 13.9 % (ref 11.5–15.5)
WBC: 8.3 10*3/uL (ref 4.0–10.5)

## 2016-11-16 LAB — BASIC METABOLIC PANEL
Anion gap: 6 (ref 5–15)
BUN: 14 mg/dL (ref 6–20)
CHLORIDE: 104 mmol/L (ref 101–111)
CO2: 27 mmol/L (ref 22–32)
Calcium: 9.3 mg/dL (ref 8.9–10.3)
Creatinine, Ser: 0.75 mg/dL (ref 0.61–1.24)
GFR calc Af Amer: >60 mL/min (ref >60)
GFR calc non Af Amer: >60 mL/min (ref >60)
GLUCOSE: 107 mg/dL — AB (ref 65–99)
POTASSIUM: 4 mmol/L (ref 3.5–5.1)
SODIUM: 137 mmol/L (ref 135–145)

## 2016-11-16 LAB — GLUCOSE, CAPILLARY
GLUCOSE-CAPILLARY: 156 mg/dL — AB (ref 65–99)
Glucose-Capillary: 98 mg/dL (ref 65–99)
Glucose-Capillary: 121 mg/dL — ABNORMAL HIGH (ref 65–99)
Glucose-Capillary: 124 mg/dL — ABNORMAL HIGH (ref 65–99)

## 2016-11-16 LAB — CULTURE, BLOOD (ROUTINE X 2)
CULTURE: NO GROWTH
Culture: NO GROWTH

## 2016-11-16 LAB — GRAM STAIN

## 2016-11-16 LAB — HEPATITIS PANEL, ACUTE
HEP A IGM: NEGATIVE
HEP B C IGM: NEGATIVE
HEP B S AG: NEGATIVE

## 2016-11-16 LAB — ANTI-DNA ANTIBODY, DOUBLE-STRANDED

## 2016-11-16 LAB — PARVOVIRUS B19 ANTIBODY, IGG AND IGM
Parovirus B19 IgG Abs: 3.1 index — ABNORMAL HIGH (ref 0.0–0.8)
Parovirus B19 IgM Abs: 0.3 index (ref 0.0–0.8)

## 2016-11-16 LAB — RHEUMATOID FACTOR: Rhuematoid fact SerPl-aCnc: 10.1 IU/mL (ref 0.0–13.9)

## 2016-11-16 LAB — B. BURGDORFI ANTIBODIES: B burgdorferi Ab IgG+IgM: <0.91 {ISR} (ref 0.00–0.90)

## 2016-11-16 LAB — CYCLIC CITRUL PEPTIDE ANTIBODY, IGG/IGA: CCP ANTIBODIES IGG/IGA: 5 U (ref 0–19)

## 2016-11-16 LAB — ANA W/REFLEX IF POSITIVE: ANA: NEGATIVE

## 2016-11-16 NOTE — Progress Notes (Addendum)
PROGRESS NOTE    Derrick Duncan  QHU:[id number] DOB: 10-11-1965 DOA: 11/14/2016 PCP: Asencion Noble, MD     Brief Narrative:  Derrick Duncan is a 51 y.o. male with history of diabetes mellitus type 2, hypertension, hyperlipidemia has been experiencing persistent fever chills joint swelling over the last 2 weeks. Patient's symptoms started on 03/20. Patient had fever chills and bilateral knee joint pains. Denies any shortness of breath or productive cough at that time. Patient had called his PCP and since patient was unable to go to the clinic Tamiflu was called in. After taking Tamiflu patient started developing diarrhea and increasing joint swelling of the knees. Since the symptoms persisted patient came to the ER on 3/29 and had arthrocentesis of his left knee. Arthrocentesis fluid showed WBC of 6400. Up until now there has been no growth. Patient also has been having persistent diarrhea. Denies any sick contacts or recent travel. Patient also states over the last 24 hours has been having increasing weakness of the right upper and lower extremity due to pain involving the right shoulder and right knee.  He denies any previous history of arthritis or joint pains. No recent illnesses. He denies any dry eyes, dry mouth, mouth or nose ulcers, rash, chest pain, shortness of breath, cough, nausea or vomiting, abdominal pain. He has had loose diarrhea for the past week which is watery in nature, denies any melena or hematochezia. He has no family history of rheumatologic disorders, denies any personal history of Crohn's or ulcerative colitis, IBD. He did have a colonoscopy years ago which was unremarkable. He denies any history of low back pain or stiffness. He has no other joint pains or stiffness. Prior to this, he was very active. His last trip was [place] in October. No sick contacts. He has right arm weakness that started 2 days ago, but not bilateral shoulder weakness or stiffness.   Assessment & Plan:   Active Problems:   HLD (hyperlipidemia)   Inflammatory arthritis   Diabetes mellitus type 2, controlled (HCC)   FUO (fever of unknown origin)   Diarrhea   Essential hypertension   Polyarticular arthritis   Fever   Fatigue  Fever with bilateral knee arthritis -Unclear etiology, ddx includes rheumatologic vs viral vs other  -Left knee arthrocentesis 3/29 (ED) with 6400 WBC, no crystals, no growth  -Right knee arthrocentesis 4/2 (Ortho) with 5300 WBC, no crystals. Gram stain, AFB, fungal culture pending  -Sed rate, CRP, ferritin are elevated consistent with inflammatory state  -HIV negative, RPR non-reactive, hepatitis panel negative, RMSF IgG and IgM negative  -Blood cultures negative to date  -Urine culture negative  -B burgdorfi Ab pending  -GC/Chlamydia (urine, oral, anal swab) pending  -Parvo B19 Ab pending  -RF negative -ANA, Anti-DNA Ab, Anti-CCP, HLA B27 pending  -ID following: If his synovial fluid not growing anything, AFB negative and GC is negative on probes, start empiric trial of corticosteroids -Attempted to reach Dr. Amil Amen [facility]) but he is out of office until 4/9   Right arm weakness -Unclear if this is related to bilateral knee swelling. MRI cervical spine with right C7 neural impingement. Will eventually need neurosurgical evaluation   Diarrhea -C diff negative -Stool PCR negative   DM type 2 -SSI, hold metformin   HTN -Lisinopril  HLD -Statin    GERD -PPI    DVT prophylaxis: SCDs Code Status: Full Family Communication: updated daughter and wife 4/2  Disposition Plan: pending work up   Consultants:  Infectious Disease  Procedures:   None  Antimicrobials:   None    Subjective: Feeling well this morning. No new complaints. His right knee seems still swollen, but improved after arthrocentesis last night. Mobility is still limited but able to ambulate with walker.   Objective: Vitals:   11/15/16 0500 11/15/16  1639 11/15/16 2219 11/16/16 0640  BP: (!) 148/94 (!) 146/93 134/84 137/72  Pulse: 67 84 65 72  Resp: _0 Temp: 98.3 F (36.8 C) 99.4 F (37.4 C) 98.6 F (37 C) 98.5 F (36.9 C)  TempSrc: Oral Oral Oral Oral  SpO2: 98% 98% 97% 97%    Intake/Output Summary (Last 24 hours) at 11/16/16 1257 Last data filed at 11/16/16 1000  Gross per 24 hour  Intake              720 ml  Output                0 ml  Net              720 ml   There were no vitals filed for this visit.  Examination:  General exam: Appears calm and comfortable  Respiratory system: Clear to auscultation. Respiratory effort normal. Cardiovascular system: S1 & S2 heard, RRR. No JVD, murmurs, rubs, gallops or clicks. No pedal edema. Gastrointestinal system: Abdomen is nondistended, soft and nontender. No organomegaly or masses felt. Normal bowel sounds heard. Central nervous system: Alert and oriented. No focal neurological deficits. Extremities: +right knee with effusion, no erythema, +slight warmth, left knee with less degree of effusion, no erythema. ROM limited. Gait is slow due to stiffness and pain, using a walker  Skin: No rashes, lesions or ulcers Psychiatry: Judgement and insight appear normal. Mood & affect appropriate.   Data Reviewed: I have personally reviewed following labs and imaging studies  CBC:  Recent Labs Lab 11/11/16 1239 11/14/16 1424 11/15/16 0549 11/16/16 0709  WBC 14.1* 10.0 8.7 8.3  NEUTROABS 10.3* 6.9  --   --   HGB 12.0* 10.6* 10.5* 11.0*  HCT 36.2* 31.8* 32.8* 33.7*  MCV 90.0 89.8 91.4 92.1  PLT 326 378 386 564*   Basic Metabolic Panel:  Recent Labs Lab 11/11/16 1239 11/14/16 1424 11/15/16 0549 11/16/16 0709  NA 139 139 141 137  K 4.1 3.8 4.1 4.0  CL 103 108 107 104  CO2 _1 GLUCOSE 118* 138* 100* 107*  BUN _2 CREATININE 0.90 0.86 0.75 0.75  CALCIUM 9.3 9.0 9.1 9.3   GFR: Estimated Creatinine Clearance: 148 mL/min (by C-G formula based on  SCr of 0.75 mg/dL). Liver Function Tests:  Recent Labs Lab 11/11/16 1239 11/14/16 1424  AST 24 27  ALT 40 48  ALKPHOS 74 82  BILITOT 1.6* 0.5  PROT 6.9 6.6  ALBUMIN 3.2* 3.0*   No results for input(s): LIPASE, AMYLASE in the last 168 hours. No results for input(s): AMMONIA in the last 168 hours. Coagulation Profile: No results for input(s): INR, PROTIME in the last 168 hours. Cardiac Enzymes:  Recent Labs Lab 11/14/16 2206  CKTOTAL 86   BNP (last 3 results) No results for input(s): PROBNP in the last 8760 hours. HbA1C: No results for input(s): HGBA1C in the last 72 hours. CBG:  Recent Labs Lab 11/15/16 1154 11/15/16 1637 11/15/16 2219 11/16/16 0627 11/16/16 1135  GLUCAP 126* 105* 123* 98 121*   Lipid Profile: No results for input(s): CHOL, HDL, LDLCALC, TRIG, CHOLHDL, LDLDIRECT  in the last 72 hours. Thyroid Function Tests:  Recent Labs  11/15/16 1426  TSH 0.621   Anemia Panel:  Recent Labs  11/15/16 1159  VITAMINB12 398  FOLATE 10.8  FERRITIN 601*  TIBC 272  IRON 21*  RETICCTPCT 1.2   Sepsis Labs:  Recent Labs Lab 11/11/16 1256 11/14/16 1456 11/14/16 1805 11/14/16 2206  LATICACIDVEN 0.76 1.31 0.71 0.9    Recent Results (from the past 240 hour(s))  Body fluid culture     Status: None   Collection Time: 11/11/16  2:37 PM  Result Value Ref Range Status   Specimen Description SYNOVIAL KNEE  Final   Special Requests NONE  Final   Gram Stain   Final    ABUNDANT WBC PRESENT,BOTH PMN AND MONONUCLEAR NO ORGANISMS SEEN    Culture NO GROWTH 3 DAYS  Final   Report Status 11/15/2016 FINAL  Final  Blood culture (routine x 2)     Status: None   Collection Time: 11/11/16  3:20 PM  Result Value Ref Range Status   Specimen Description BLOOD RIGHT ANTECUBITAL  Final   Special Requests BOTTLES DRAWN AEROBIC AND ANAEROBIC 5CC  Final   Culture NO GROWTH 5 DAYS  Final   Report Status 11/16/2016 FINAL  Final  Blood culture (routine x 2)     Status:  None   Collection Time: 11/11/16  3:25 PM  Result Value Ref Range Status   Specimen Description BLOOD RIGHT HAND  Final   Special Requests BOTTLES DRAWN AEROBIC AND ANAEROBIC 5CC  Final   Culture NO GROWTH 5 DAYS  Final   Report Status 11/16/2016 FINAL  Final  Urine culture     Status: None   Collection Time: 11/14/16  4:25 PM  Result Value Ref Range Status   Specimen Description URINE, RANDOM  Final   Special Requests NONE  Final   Culture NO GROWTH  Final   Report Status 11/15/2016 FINAL  Final  Culture, blood (routine x 2)     Status: None (Preliminary result)   Collection Time: 11/14/16  5:56 PM  Result Value Ref Range Status   Specimen Description BLOOD RIGHT ANTECUBITAL  Final   Special Requests   Final    BOTTLES DRAWN AEROBIC AND ANAEROBIC Blood Culture adequate volume   Culture NO GROWTH 2 DAYS  Final   Report Status PENDING  Incomplete  Culture, blood (routine x 2)     Status: None (Preliminary result)   Collection Time: 11/14/16  9:49 PM  Result Value Ref Range Status   Specimen Description BLOOD LEFT ARM  Final   Special Requests IN PEDIATRIC BOTTLE Blood Culture adequate volume  Final   Culture NO GROWTH 2 DAYS  Final   Report Status PENDING  Incomplete  Gastrointestinal Panel by PCR , Stool     Status: None   Collection Time: 11/15/16 10:15 AM  Result Value Ref Range Status   Campylobacter species NOT DETECTED NOT DETECTED Final   Plesimonas shigelloides NOT DETECTED NOT DETECTED Final   Salmonella species NOT DETECTED NOT DETECTED Final   Yersinia enterocolitica NOT DETECTED NOT DETECTED Final   Vibrio species NOT DETECTED NOT DETECTED Final   Vibrio cholerae NOT DETECTED NOT DETECTED Final   Enteroaggregative E coli (EAEC) NOT DETECTED NOT DETECTED Final   Enteropathogenic E coli (EPEC) NOT DETECTED NOT DETECTED Final   Enterotoxigenic E coli (ETEC) NOT DETECTED NOT DETECTED Final   Shiga like toxin producing E coli (STEC) NOT DETECTED NOT DETECTED Final  Shigella/Enteroinvasive E coli (EIEC) NOT DETECTED NOT DETECTED Final   Cryptosporidium NOT DETECTED NOT DETECTED Final   Cyclospora cayetanensis NOT DETECTED NOT DETECTED Final   Entamoeba histolytica NOT DETECTED NOT DETECTED Final   Giardia lamblia NOT DETECTED NOT DETECTED Final   Adenovirus F40/41 NOT DETECTED NOT DETECTED Final   Astrovirus NOT DETECTED NOT DETECTED Final   Norovirus GI/GII NOT DETECTED NOT DETECTED Final   Rotavirus A NOT DETECTED NOT DETECTED Final   Sapovirus (I, II, IV, and V) NOT DETECTED NOT DETECTED Final  C difficile quick scan w PCR reflex     Status: None   Collection Time: 11/15/16 10:15 AM  Result Value Ref Range Status   C Diff antigen NEGATIVE NEGATIVE Final   C Diff toxin NEGATIVE NEGATIVE Final   C Diff interpretation No C. difficile detected.  Final  Culture, fungus without smear     Status: None (Preliminary result)   Collection Time: 11/15/16  9:50 PM  Result Value Ref Range Status   Specimen Description SYNOVIAL RIGHT KNEE  Final   Special Requests NONE  Final   Culture NO FUNGUS ISOLATED AFTER 1 DAY  Final   Report Status PENDING  Incomplete       Radiology Studies: Dg Chest 2 View  Result Date: 11/14/2016 CLINICAL DATA:  Fever EXAM: CHEST  2 VIEW COMPARISON:  11/11/2016 chest radiograph. FINDINGS: Stable cardiomediastinal silhouette with normal heart size. No pneumothorax. No pleural effusion. Lungs appear clear, with no acute consolidative airspace disease and no pulmonary edema. IMPRESSION: No active cardiopulmonary disease. Electronically Signed   By: Ilona Sorrel M.D.   On: 11/14/2016 17:48   Ct Head Wo Contrast  Result Date: 11/14/2016 CLINICAL DATA:  Initial evaluation for acute headache, right-sided weakness. EXAM: CT HEAD WITHOUT CONTRAST TECHNIQUE: Contiguous axial images were obtained from the base of the skull through the vertex without intravenous contrast. COMPARISON:  Prior head CT from 01/05/2013. FINDINGS: Brain:  Encephalomalacia at the peripheral left temporal lobe is stable from prior, likely related to remote traumatic injury. Cerebral volume otherwise normal. No acute intracranial hemorrhage. No evidence for acute large vessel territory infarct. No mass lesion, midline shift or mass effect. No hydrocephalus. No extra-axial fluid collection. Vascular: No hyperdense vessel. Skull: Scalp soft tissues within normal limits.  Calvarium intact. Sinuses/Orbits: Globes and orbital soft tissues within normal limits. Mild scattered mucosal thickening within the ethmoidal air cells. Paranasal sinuses are otherwise clear. No mastoid effusion. Other: None. IMPRESSION: 1. No acute intracranial process. 2. 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 3. Otherwise negative head CT. Electronically Signed   By: Jeannine Boga M.D.   On: 11/14/2016 18:45   Mr Brain Wo Contrast  Result Date: 11/15/2016 CLINICAL DATA:  Joint pain and headaches. RIGHT arm weakness. History of [facility] spotted fever. EXAM: MRI HEAD WITHOUT CONTRAST MRI CERVICAL SPINE WITHOUT CONTRAST TECHNIQUE: Multiplanar, multiecho pulse sequences of the brain and surrounding structures, and cervical spine, to include the craniocervical junction and cervicothoracic junction, were obtained without intravenous contrast. COMPARISON:  CT head 11/14/2016.  CT head 01/05/2013. FINDINGS: MRI HEAD FINDINGS Brain: No evidence for acute infarction, hemorrhage, mass lesion, hydrocephalus, or extra-axial fluid. Normal for age cerebral volume, except for LEFT temporal encephalomalacia. Small craniectomy defect, as well as chronic blood products in this region, sequelae of treatment for what was reportedly a subdural hematoma. Minor white matter disease remote from the area of LEFT temporal brain injury, nonspecific. Vascular:  Normal flow voids. Skull and upper cervical spine: Normal marrow signal.  Sinuses/Orbits: Negative. Other: None. MRI CERVICAL SPINE FINDINGS Alignment: Reversal of the normal cervical lordotic curve. No subluxation. Vertebrae: No worrisome osseous lesion. Cord: Cord compression without abnormal cord signal at C3-4, C5-6, and C6-7. Posterior Fossa, vertebral arteries, paraspinal tissues: Unremarkable. Disc levels: C2-3:  Normal. C3-4: Central and leftward protrusion/disc osteophyte complex with LEFT-sided uncinate spurring. LEFT-sided cord flattening is prominent. Canal diameter 6-7 mm. LEFT C4 foraminal narrowing. C4-5: Annular bulge. RIGHT-sided uncinate spurring. RIGHT C5 foraminal narrowing. C5-6: Central and leftward protrusion/ disc osteophyte complex. Mild facet arthropathy. LEFT-sided cord flattening. Canal diameter 6-7 mm. LEFT C6 foraminal narrowing. C6-7: Central and rightward disc extrusion. Caudally migrated free fragment. Central and RIGHT-sided cord flattening. Canal diameter 6-7 mm. RIGHT greater than LEFT C7 foraminal narrowing. C7-T1:  Unremarkable. 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 No acute intracranial findings. No findings suggestive of rickettsial disease Motion degraded MRI cervical spine demonstrates a central and rightward disc extrusion at C6-7, caudally migrated free fragment. Correlate clinically for RIGHT C7 neural impingement contributing to RIGHT arm weakness. Cord flattening without abnormal cord signal at multiple levels. Electronically Signed   By: Staci Righter M.D.   On: 11/15/2016 09:23   Mr Cervical Spine Wo Contrast  Result Date: 11/15/2016 CLINICAL DATA:  Joint pain and headaches. RIGHT arm weakness. History of [facility] spotted fever. EXAM: MRI HEAD WITHOUT CONTRAST MRI CERVICAL SPINE WITHOUT CONTRAST TECHNIQUE: Multiplanar, multiecho pulse sequences of the brain and surrounding structures, and cervical spine, to include the  craniocervical junction and cervicothoracic junction, were obtained without intravenous contrast. COMPARISON:  CT head 11/14/2016.  CT head 01/05/2013. FINDINGS: MRI HEAD FINDINGS Brain: No evidence for acute infarction, hemorrhage, mass lesion, hydrocephalus, or extra-axial fluid. Normal for age cerebral volume, except for LEFT temporal encephalomalacia. Small craniectomy defect, as well as chronic blood products in this region, sequelae of treatment for what was reportedly a subdural hematoma. Minor white matter disease remote from the area of LEFT temporal brain injury, nonspecific. Vascular: Normal flow voids. Skull and upper cervical spine: Normal marrow signal. Sinuses/Orbits: Negative. Other: None. MRI CERVICAL SPINE FINDINGS Alignment: Reversal of the normal cervical lordotic curve. No subluxation. Vertebrae: No worrisome osseous lesion. Cord: Cord compression without abnormal cord signal at C3-4, C5-6, and C6-7. Posterior Fossa, vertebral arteries, paraspinal tissues: Unremarkable. Disc levels: C2-3:  Normal. C3-4: Central and leftward protrusion/disc osteophyte complex with LEFT-sided uncinate spurring. LEFT-sided cord flattening is prominent. Canal diameter 6-7 mm. LEFT C4 foraminal narrowing. C4-5: Annular bulge. RIGHT-sided uncinate spurring. RIGHT C5 foraminal narrowing. C5-6: Central and leftward protrusion/ disc osteophyte complex. Mild facet arthropathy. LEFT-sided cord flattening. Canal diameter 6-7 mm. LEFT C6 foraminal narrowing. C6-7: Central and rightward disc extrusion. Caudally migrated free fragment. Central and RIGHT-sided cord flattening. Canal diameter 6-7 mm. RIGHT greater than LEFT C7 foraminal narrowing. C7-T1:  Unremarkable. 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 No acute intracranial findings. No findings suggestive of rickettsial disease Motion degraded MRI cervical spine  demonstrates a central and rightward disc extrusion at C6-7, caudally migrated free fragment. Correlate clinically for RIGHT C7 neural impingement contributing to RIGHT arm weakness. Cord flattening without abnormal cord signal at multiple levels. Electronically Signed   By: Staci Righter M.D.   On: 11/15/2016 09:23      Scheduled Meds: .  insulin aspart  0-9 Units Subcutaneous TID WC  . lisinopril  40 mg Oral Daily  . pantoprazole  40 mg Oral Daily  . simvastatin  40 mg Oral q morning - 10a   Continuous Infusions:    LOS: 1 day    Time spent: 30 minutes   Dessa Phi, DO [facility] [email or web address] Password [facility] 11/16/2016, 12:57 PM

## 2016-11-16 NOTE — Progress Notes (Signed)
     Subjective:      Patient reports pain as mild to moderate.  He did get up with PT and worked the knees earlier.  No new events throughout the night.  Results of the aspiration were reviewed with the patient.  Objective:   VITALS:   Vitals:   11/16/16 0640 11/16/16 1500  BP: 137/72 (!) 150/98  Pulse: 72 79  Resp: 17 18  Temp: 98.5 F (36.9 C) 98.9 F (37.2 C)    Dorsiflexion/Plantar flexion intact No cellulitis present Compartment soft  LABS  Recent Labs  11/14/16 1424 11/15/16 0549 11/16/16 0709  HGB 10.6* 10.5* 11.0*  HCT 31.8* 32.8* 33.7*  WBC 10.0 8.7 8.3  PLT 378 386 438*     Recent Labs  11/14/16 1424 11/15/16 0549 11/16/16 0709  NA 139 141 137  K 3.8 4.1 4.0  BUN CREATININE 0.86 0.75 0.75  GLUCOSE 138* 100* 107*     Assessment/Plan:  As a results of the labs the patient does not have an infected knee.  We have discussed an inflammatory process / AI, this is being pursued by ID. He was placed on prednisone and we have discussed NSAIDs if tolerated.   Up with therapy  Follow up with Dr. Charlann Boxer as needed.    Anastasio Auerbach Kelsye Loomer   PAC  11/16/2016, 3:36 PM

## 2016-11-16 NOTE — Progress Notes (Addendum)
Subjective: No new complaints, he has better ROM of right knee after aspiration of knee joint by Dr. Alvan Dame yesterday   Antibiotics:  Anti-infectives    None      Medications: Scheduled Meds: . insulin aspart  0-9 Units Subcutaneous TID WC  . lisinopril  40 mg Oral Daily  . pantoprazole  40 mg Oral Daily  . simvastatin  40 mg Oral q morning - 10a   Continuous Infusions: PRN Meds:.acetaminophen **OR** acetaminophen, ibuprofen, ondansetron **OR** ondansetron (ZOFRAN) IV    Objective: Weight change:   Intake/Output Summary (Last 24 hours) at 11/16/16 1214 Last data filed at 11/16/16 1000  Gross per 24 hour  Intake              720 ml  Output                0 ml  Net              720 ml   Blood pressure 137/72, pulse 72, temperature 98.5 F (36.9 C), temperature source Oral, resp. rate 17, SpO2 97 %. Temp:  [98.5 F (36.9 C)-99.4 F (37.4 C)] 98.5 F (36.9 C) (04/03 0640) Pulse Rate:  [65-84] 72 (04/03 0640) Resp:  [17] 17 (04/03 0640) BP: (134-146)/(72-93) 137/72 (04/03 0640) SpO2:  [97 %-98 %] 97 % (04/03 0640)  Physical Exam: General: Alert and awake, oriented x3, not in any acute distress. HEENT: anicteric sclera, , EOMI CVS regular rate, normal r,  no murmur rubs or gallops Chest: clear to auscultation bilaterally, no wheezing, rales or rhonchi Abdomen: soft nontender, nondistended, normal bowel sounds, Extremities better ROM to nearly 90 degrees with right and left knee now, both have tenderness, his ROM in shoulder still significantly limited Skin: no rashes Neuro: nonfocal  CBC:  CBC Latest Ref Rng & Units 11/16/2016 11/15/2016 11/14/2016  WBC 4.0 - 10.5 K/uL 8.3 8.7 10.0  Hemoglobin 13.0 - 17.0 g/dL 11.0(L) 10.5(L) 10.6(L)  Hematocrit 39.0 - 52.0 % 33.7(L) 32.8(L) 31.8(L)  Platelets 150 - 400 K/uL 438(H) 386 378     BMET  Recent Labs  11/15/16 0549 11/16/16 0709  NA 141 137  K 4.1 4.0  CL 107 104  CO2 27 27  GLUCOSE 100* 107*  BUN  13 14  CREATININE 0.75 0.75  CALCIUM 9.1 9.3     Liver Panel   Recent Labs  11/14/16 1424  PROT 6.6  ALBUMIN 3.0*  AST 27  ALT 48  ALKPHOS 82  BILITOT 0.5       Sedimentation Rate  Recent Labs  11/14/16 1426  ESRSEDRATE 114*   C-Reactive Protein  Recent Labs  11/14/16 1444  CRP 12.5*    Micro Results: Recent Results (from the past 720 hour(s))  Body fluid culture     Status: None   Collection Time: 11/11/16  2:37 PM  Result Value Ref Range Status   Specimen Description SYNOVIAL KNEE  Final   Special Requests NONE  Final   Gram Stain   Final    ABUNDANT WBC PRESENT,BOTH PMN AND MONONUCLEAR NO ORGANISMS SEEN    Culture NO GROWTH 3 DAYS  Final   Report Status 11/15/2016 FINAL  Final  Blood culture (routine x 2)     Status: None   Collection Time: 11/11/16  3:20 PM  Result Value Ref Range Status   Specimen Description BLOOD RIGHT ANTECUBITAL  Final   Special Requests BOTTLES DRAWN AEROBIC AND ANAEROBIC 5CC  Final   Culture NO GROWTH 5 DAYS  Final   Report Status 11/16/2016 FINAL  Final  Blood culture (routine x 2)     Status: None   Collection Time: 11/11/16  3:25 PM  Result Value Ref Range Status   Specimen Description BLOOD RIGHT HAND  Final   Special Requests BOTTLES DRAWN AEROBIC AND ANAEROBIC 5CC  Final   Culture NO GROWTH 5 DAYS  Final   Report Status 11/16/2016 FINAL  Final  Urine culture     Status: None   Collection Time: 11/14/16  4:25 PM  Result Value Ref Range Status   Specimen Description URINE, RANDOM  Final   Special Requests NONE  Final   Culture NO GROWTH  Final   Report Status 11/15/2016 FINAL  Final  Culture, blood (routine x 2)     Status: None (Preliminary result)   Collection Time: 11/14/16  5:56 PM  Result Value Ref Range Status   Specimen Description BLOOD RIGHT ANTECUBITAL  Final   Special Requests   Final    BOTTLES DRAWN AEROBIC AND ANAEROBIC Blood Culture adequate volume   Culture NO GROWTH 2 DAYS  Final   Report  Status PENDING  Incomplete  Culture, blood (routine x 2)     Status: None (Preliminary result)   Collection Time: 11/14/16  9:49 PM  Result Value Ref Range Status   Specimen Description BLOOD LEFT ARM  Final   Special Requests IN PEDIATRIC BOTTLE Blood Culture adequate volume  Final   Culture NO GROWTH 2 DAYS  Final   Report Status PENDING  Incomplete  Gastrointestinal Panel by PCR , Stool     Status: None   Collection Time: 11/15/16 10:15 AM  Result Value Ref Range Status   Campylobacter species NOT DETECTED NOT DETECTED Final   Plesimonas shigelloides NOT DETECTED NOT DETECTED Final   Salmonella species NOT DETECTED NOT DETECTED Final   Yersinia enterocolitica NOT DETECTED NOT DETECTED Final   Vibrio species NOT DETECTED NOT DETECTED Final   Vibrio cholerae NOT DETECTED NOT DETECTED Final   Enteroaggregative E coli (EAEC) NOT DETECTED NOT DETECTED Final   Enteropathogenic E coli (EPEC) NOT DETECTED NOT DETECTED Final   Enterotoxigenic E coli (ETEC) NOT DETECTED NOT DETECTED Final   Shiga like toxin producing E coli (STEC) NOT DETECTED NOT DETECTED Final   Shigella/Enteroinvasive E coli (EIEC) NOT DETECTED NOT DETECTED Final   Cryptosporidium NOT DETECTED NOT DETECTED Final   Cyclospora cayetanensis NOT DETECTED NOT DETECTED Final   Entamoeba histolytica NOT DETECTED NOT DETECTED Final   Giardia lamblia NOT DETECTED NOT DETECTED Final   Adenovirus F40/41 NOT DETECTED NOT DETECTED Final   Astrovirus NOT DETECTED NOT DETECTED Final   Norovirus GI/GII NOT DETECTED NOT DETECTED Final   Rotavirus A NOT DETECTED NOT DETECTED Final   Sapovirus (I, II, IV, and V) NOT DETECTED NOT DETECTED Final  C difficile quick scan w PCR reflex     Status: None   Collection Time: 11/15/16 10:15 AM  Result Value Ref Range Status   C Diff antigen NEGATIVE NEGATIVE Final   C Diff toxin NEGATIVE NEGATIVE Final   C Diff interpretation No C. difficile detected.  Final  Culture, fungus without smear      Status: None (Preliminary result)   Collection Time: 11/15/16  9:50 PM  Result Value Ref Range Status   Specimen Description SYNOVIAL RIGHT KNEE  Final   Special Requests NONE  Final   Culture NO FUNGUS ISOLATED AFTER  1 DAY  Final   Report Status PENDING  Incomplete    Studies/Results: Dg Chest 2 View  Result Date: 11/14/2016 CLINICAL DATA:  Fever EXAM: CHEST  2 VIEW COMPARISON:  11/11/2016 chest radiograph. FINDINGS: Stable cardiomediastinal silhouette with normal heart size. No pneumothorax. No pleural effusion. Lungs appear clear, with no acute consolidative airspace disease and no pulmonary edema. IMPRESSION: No active cardiopulmonary disease. Electronically Signed   By: Ilona Sorrel M.D.   On: 11/14/2016 17:48   Ct Head Wo Contrast  Result Date: 11/14/2016 CLINICAL DATA:  Initial evaluation for acute headache, right-sided weakness. EXAM: CT HEAD WITHOUT CONTRAST TECHNIQUE: Contiguous axial images were obtained from the base of the skull through the vertex without intravenous contrast. COMPARISON:  Prior head CT from 01/05/2013. 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 Cerebral volume otherwise normal. No acute intracranial hemorrhage. No evidence for acute large vessel territory infarct. No mass lesion, midline shift or mass effect. No hydrocephalus. No extra-axial fluid collection. Vascular: No hyperdense vessel. Skull: Scalp soft tissues within normal limits.  Calvarium intact. Sinuses/Orbits: Globes and orbital soft tissues within normal limits. Mild scattered mucosal thickening within the ethmoidal air cells. Paranasal sinuses are otherwise clear. No mastoid effusion. Other: None. IMPRESSION: 1. No acute intracranial process. 2. 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 3. Otherwise negative head CT. Electronically Signed   By:  Jeannine Boga M.D.   On: 11/14/2016 18:45   Mr Brain Wo Contrast  Result Date: 11/15/2016 CLINICAL DATA:  Joint pain and headaches. RIGHT arm weakness. History of [facility] spotted fever. EXAM: MRI HEAD WITHOUT CONTRAST MRI CERVICAL SPINE WITHOUT CONTRAST TECHNIQUE: Multiplanar, multiecho pulse sequences of the brain and surrounding structures, and cervical spine, to include the craniocervical junction and cervicothoracic junction, were obtained without intravenous contrast. COMPARISON:  CT head 11/14/2016.  CT head 01/05/2013. FINDINGS: MRI HEAD FINDINGS Brain: No evidence for acute infarction, hemorrhage, mass lesion, hydrocephalus, or extra-axial fluid. Normal for age cerebral volume, except for LEFT temporal encephalomalacia. Small craniectomy defect, as well as chronic blood products in this region, sequelae of treatment for what was reportedly a subdural hematoma. Minor white matter disease remote from the area of LEFT temporal brain injury, nonspecific. Vascular: Normal flow voids. Skull and upper cervical spine: Normal marrow signal. Sinuses/Orbits: Negative. Other: None. MRI CERVICAL SPINE FINDINGS Alignment: Reversal of the normal cervical lordotic curve. No subluxation. Vertebrae: No worrisome osseous lesion. Cord: Cord compression without abnormal cord signal at C3-4, C5-6, and C6-7. Posterior Fossa, vertebral arteries, paraspinal tissues: Unremarkable. Disc levels: C2-3:  Normal. C3-4: Central and leftward protrusion/disc osteophyte complex with LEFT-sided uncinate spurring. LEFT-sided cord flattening is prominent. Canal diameter 6-7 mm. LEFT C4 foraminal narrowing. C4-5: Annular bulge. RIGHT-sided uncinate spurring. RIGHT C5 foraminal narrowing. C5-6: Central and leftward protrusion/ disc osteophyte complex. Mild facet arthropathy. LEFT-sided cord flattening. Canal diameter 6-7 mm. LEFT C6 foraminal narrowing. C6-7: Central and rightward disc extrusion. Caudally migrated free fragment.  Central and RIGHT-sided cord flattening. Canal diameter 6-7 mm. RIGHT greater than LEFT C7 foraminal narrowing. C7-T1:  Unremarkable. 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 No acute intracranial findings. No findings suggestive of rickettsial disease Motion degraded MRI cervical spine demonstrates a central and rightward disc extrusion at C6-7, caudally migrated free fragment. Correlate clinically for RIGHT C7 neural impingement contributing  to RIGHT arm weakness. Cord flattening without abnormal cord signal at multiple levels. Electronically Signed   By: Staci Righter M.D.   On: 11/15/2016 09:23   Mr Cervical Spine Wo Contrast  Result Date: 11/15/2016 CLINICAL DATA:  Joint pain and headaches. RIGHT arm weakness. History of [facility] spotted fever. EXAM: MRI HEAD WITHOUT CONTRAST MRI CERVICAL SPINE WITHOUT CONTRAST TECHNIQUE: Multiplanar, multiecho pulse sequences of the brain and surrounding structures, and cervical spine, to include the craniocervical junction and cervicothoracic junction, were obtained without intravenous contrast. COMPARISON:  CT head 11/14/2016.  CT head 01/05/2013. FINDINGS: MRI HEAD FINDINGS Brain: No evidence for acute infarction, hemorrhage, mass lesion, hydrocephalus, or extra-axial fluid. Normal for age cerebral volume, except for LEFT temporal encephalomalacia. Small craniectomy defect, as well as chronic blood products in this region, sequelae of treatment for what was reportedly a subdural hematoma. Minor white matter disease remote from the area of LEFT temporal brain injury, nonspecific. Vascular: Normal flow voids. Skull and upper cervical spine: Normal marrow signal. Sinuses/Orbits: Negative. Other: None. MRI CERVICAL SPINE FINDINGS Alignment: Reversal of the normal cervical lordotic curve. No subluxation. Vertebrae: No worrisome osseous lesion. Cord: Cord compression  without abnormal cord signal at C3-4, C5-6, and C6-7. Posterior Fossa, vertebral arteries, paraspinal tissues: Unremarkable. Disc levels: C2-3:  Normal. C3-4: Central and leftward protrusion/disc osteophyte complex with LEFT-sided uncinate spurring. LEFT-sided cord flattening is prominent. Canal diameter 6-7 mm. LEFT C4 foraminal narrowing. C4-5: Annular bulge. RIGHT-sided uncinate spurring. RIGHT C5 foraminal narrowing. C5-6: Central and leftward protrusion/ disc osteophyte complex. Mild facet arthropathy. LEFT-sided cord flattening. Canal diameter 6-7 mm. LEFT C6 foraminal narrowing. C6-7: Central and rightward disc extrusion. Caudally migrated free fragment. Central and RIGHT-sided cord flattening. Canal diameter 6-7 mm. RIGHT greater than LEFT C7 foraminal narrowing. C7-T1:  Unremarkable. 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 No acute intracranial findings. No findings suggestive of rickettsial disease Motion degraded MRI cervical spine demonstrates a central and rightward disc extrusion at C6-7, caudally migrated free fragment. Correlate clinically for RIGHT C7 neural impingement contributing to RIGHT arm weakness. Cord flattening without abnormal cord signal at multiple levels. Electronically Signed   By: Staci Righter M.D.   On: 11/15/2016 09:23      Assessment/Plan:  INTERVAL HISTORY: repeat aspirate of oppposite knee performed by Dr Alvan Dame and Cell count very similar to prior aspirate   Active Problems:   HLD (hyperlipidemia)   Inflammatory arthritis   Diabetes mellitus type 2, controlled ([place])   FUO (fever of unknown origin)   Diarrhea   Essential hypertension   Polyarticular arthritis   Fever    Derrick Duncan is a 51 y.o. male with   with  polyarticular inflammatory arthritis that developed after a "flulike illness." While his's sign over the fluid was not very convincing for  a bacterial infection he was on Augmentin for several days prior to the joint having been tapped.  #1 Inflammatory arthritis:  Greatly appreciate Dr. Aurea Graff help here  He has QUITE HIGH ESR  His repeat cell count is similar to prior one and NOT c/w bacterial pyogenic arthritis  HE STILL NEEDS TO BE CHECKED FOR GC AND CHLAMYDIA WITH URINE PROBE, OROPHARYNX SWAB AND RECTAL SWAB  No fungi isolated in first day. Repeat synovial fluid culture still incubating, no gs done, AFB smear not back yet  His HIV, hep panels are negative as is his GI pathogen panel  I  think this is either a post viral autoimmune process or declaration of AI condition. If his synovial fluid not growing anything, AFB negative and GC is negative on probes I would be comfortable with empiric trial of corticosteroids. I would get him set up with Dr. Amil Amen with [facility].   I spent greater than 35 minutes with the patient including greater than 50% of time in face to face counsel of the patient re his inflammatory arthritis, labs done so far and in coordination of his care.     LOS: 1 day   Alcide Evener 11/16/2016, 12:14 PM

## 2016-11-16 NOTE — Progress Notes (Signed)
Physical Therapy Evaluation and Discharge  Patient Details Name: Derrick Duncan MRN: [id number] DOB: [birth year] Today's Date: 11/16/2016   History of Present Illness  Pt is a 51 yo male with inflammatory arthritis of bilateral knees and inability to lift his R arm above 45 degrees. Pt s/p aspiration of fluid from bilateral knees. PMH includes DM 2, HTN, Hyperlipedemia and [place] Spotted Fever.   Clinical Impression  Pt is independent in bed mobility, transfers, and ambulation. Pt ambulates with slight R hip hike due to decreased R knee flexion, no LoB no buckling and is safe. Pt does not require PT follow up unless there is a change in his mobility.     Follow Up Recommendations No PT follow up    Equipment Recommendations  None recommended by PT    Recommendations for Other Services       Precautions / Restrictions        Mobility  Bed Mobility Overal bed mobility: Independent                Transfers Overall transfer level: Independent                  Ambulation/Gait Ambulation/Gait assistance: Independent Ambulation Distance (Feet): 300 Feet Assistive device: None Gait Pattern/deviations: Antalgic;Decreased stance time - right   Gait velocity interpretation: at or above normal speed for age/gender General Gait Details: slow, steady gait, no LoB, no buckling, decreased stance time on R LE, decreased R knee flexion, R hip hike to advance R LE        Balance Overall balance assessment: Independent                                           Pertinent Vitals/Pain Pain Assessment: 0-10 Pain Score: 7  Pain Location: R shoulder, bilateral knees Pain Descriptors / Indicators: Dull;Burning;Guarding Pain Intervention(s): Limited activity within patient's tolerance;Monitored during session         VSS  Home Living Family/patient expects to be discharged to:: Private residence Living Arrangements: Spouse/significant  other;Children Available Help at Discharge: Family;Friend(s);Available 24 hours/day Type of Home: House Home Access: Stairs to enter Entrance Stairs-Rails: Can reach both Entrance Stairs-Number of Steps: 2 Home Layout: Multi-level;Able to live on main level with bedroom/bathroom Home Equipment: Walker - 2 wheels      Prior Function Level of Independence: Independent         Comments: community ambulator, driver, works     Higher education careers adviser        Extremity/Trunk Assessment   Upper Extremity Assessment Upper Extremity Assessment: Defer to OT evaluation    Lower Extremity Assessment Lower Extremity Assessment: RLE deficits/detail;LLE deficits/detail RLE Deficits / Details: decreased ROM in R knee 5 to 100 degrees RLE Sensation:  (pt with numbness and tingling in 3, 4, 5  rays of R foot ) LLE Deficits / Details: decreased ROM in R knee 0 to 112 degrees    Cervical / Trunk Assessment Cervical / Trunk Assessment: Normal  Communication   Communication: No difficulties  Cognition Arousal/Alertness: Awake/alert Behavior During Therapy: WFL for tasks assessed/performed Overall Cognitive Status: Within Functional Limits for tasks assessed  Assessment/Plan    PT Assessment Patent does not need any further PT services  PT Problem List         PT Treatment Interventions      PT Goals (Current goals can be found in the Care Plan section)  Acute Rehab PT Goals Patient Stated Goal: go home               End of Session Equipment Utilized During Treatment: Gait belt Activity Tolerance: Patient tolerated treatment well Patient left: in bed;with call bell/phone within reach Nurse Communication: Mobility status      Time: 1400-1432 PT Time Calculation (min) (ACUTE ONLY): 32 min   Charges:   PT Evaluation $PT Eval Low Complexity: 1 Procedure PT Treatments $Gait Training: 8-22 mins   PT G Codes:         Lynnita Somma B. Beverely Risen PT, DPT Acute Rehabilitation  [phone] Pager [phone]    Elon Alas Fleet 11/16/2016, 3:00 PM

## 2016-11-17 DIAGNOSIS — I10 Essential (primary) hypertension: Secondary | ICD-10-CM

## 2016-11-17 DIAGNOSIS — R531 Weakness: Secondary | ICD-10-CM

## 2016-11-17 LAB — GLUCOSE, CAPILLARY
GLUCOSE-CAPILLARY: 75 mg/dL (ref 65–99)
GLUCOSE-CAPILLARY: 129 mg/dL — AB (ref 65–99)
Glucose-Capillary: 141 mg/dL — ABNORMAL HIGH (ref 65–99)
Glucose-Capillary: 99 mg/dL (ref 65–99)

## 2016-11-17 LAB — GC/CHLAMYDIA PROBE AMP (~~LOC~~) NOT AT ARMC
CHLAMYDIA, DNA PROBE: NEGATIVE
CHLAMYDIA, DNA PROBE: NEGATIVE
Chlamydia: NEGATIVE
NEISSERIA GONORRHEA: NEGATIVE
Neisseria Gonorrhea: NEGATIVE
Neisseria Gonorrhea: NEGATIVE

## 2016-11-17 MED ORDER — PREDNISONE 20 MG PO TABS
60.0000 mg | ORAL_TABLET | Freq: Every day | ORAL | Status: DC
  Administered 2016-11-17 – 2016-11-18 (×2): 60 mg via ORAL
  Filled 2016-11-17 (×2): qty 3

## 2016-11-17 NOTE — Evaluation (Signed)
Occupational Therapy Evaluation Patient Details Name: Derrick Duncan MRN: [id number] DOB: [birth year] Today's Date: 11/17/2016    History of Present Illness Pt is a 51 yo male with inflammatory arthritis of bilateral knees and inability to lift his R arm above 45 degrees. Pt s/p aspiration of fluid from bilateral knees. PMH includes DM 2, HTN, Hyperlipedemia and [place] Spotted Fever.    Clinical Impression   Pt is independent with ADLs and ADL mobility. Pt with decreased AROM and strength in R UE/shoulder of unknown cause at this time. Pt instructed on R AROM/shoulder exercises with handouts and theraband provided. No further acute OT indicated at this time    Follow Up Recommendations  No OT follow up    Equipment Recommendations  None recommended by OT    Recommendations for Other Services       Precautions / Restrictions Precautions Precautions: None Restrictions Weight Bearing Restrictions: No      Mobility Bed Mobility Overal bed mobility: Independent                Transfers Overall transfer level: Independent                    Balance Overall balance assessment: Independent;No apparent balance deficits (not formally assessed)                                         ADL either performed or assessed with clinical judgement   ADL Overall ADL's : At baseline;Independent                                             Vision Patient Visual Report: No change from baseline                  Pertinent Vitals/Pain Pain Assessment: 0-10 Pain Score: 6  Pain Location: R shoulder, bilateral knees Pain Descriptors / Indicators: Dull Pain Intervention(s): Limited activity within patient's tolerance;Monitored during session;Premedicated before session     Hand Dominance Left   Extremity/Trunk Assessment Upper Extremity Assessment Upper Extremity Assessment: RUE deficits/detail;Generalized weakness RUE  Deficits / Details: decreased AROM, approximately 45-60 flexion, 3/5 grossly LUE Deficits / Details: decreased AROM, approximately 45-60 flexion, 3/5 grossly           Communication Communication Communication: No difficulties   Cognition Arousal/Alertness: Awake/alert Behavior During Therapy: WFL for tasks assessed/performed Overall Cognitive Status: Within Functional Limits for tasks assessed                                     General Comments   pt very pleasant and cooperative    Exercises Other Exercises Other Exercises: Pt and famly instructed on R UE ROM exercises with handouts and theraband         Home Living Family/patient expects to be discharged to:: Private residence Living Arrangements: Spouse/significant other;Children Available Help at Discharge: Family;Friend(s);Available 24 hours/day Type of Home: House Home Access: Stairs to enter [employer] of Steps: 2 Entrance Stairs-Rails: Can reach both Home Layout: Multi-level;Able to live on main level with bedroom/bathroom     Bathroom Shower/Tub: Producer, television/film/video: Standard     Home Equipment: Environmental consultant -  2 wheels          Prior Functioning/Environment Level of Independence: Independent        Comments: community ambulator, driver, works        OT Problem List: Impaired UE functional use;Decreased strength;Decreased range of motion;Pain      OT Treatment/Interventions:      OT Goals(Current goals can be found in the care plan section) Acute Rehab OT Goals Patient Stated Goal: go home OT Goal Formulation: With patient/family  OT Frequency:     Barriers to D/C:  none                        End of Session    Activity Tolerance: Patient tolerated treatment well Patient left: in chair;with call bell/phone within reach;with family/visitor present  OT Visit Diagnosis: Muscle weakness (generalized) (M62.81);Pain Pain - Right/Left: Right Pain  - part of body: Shoulder                Time: 1610-9604 OT Time Calculation (min): 30 min Charges:  OT Evaluation $OT Eval Low Complexity: 1 Procedure OT Treatments $Therapeutic Activity: 8-22 mins $Therapeutic Exercise: 8-22 mins G-Codes: OT G-codes **NOT FOR INPATIENT CLASS** Functional Assessment Tool Used: AM-PAC 6 Clicks Daily Activity     Galen Manila 11/17/2016, 10:56 AM

## 2016-11-17 NOTE — Progress Notes (Signed)
PROGRESS NOTE                                                                                                                                                                                                             Patient Demographics:    Derrick Duncan, is a 51 y.o. male, DOB - Nov 13, 1965, PJK:[id number]  Admit date - 11/14/2016   Admitting Physician Rise Patience, MD  Outpatient Primary MD for the patient is Asencion Noble, MD  LOS - 2  Outpatient Specialists:NONE  Chief Complaint  Patient presents with  . Knee Pain  . Fever       Brief Narrative  51 y.o.malewith history of diabetes mellitus type 2, hypertension, hyperlipidemia has been experiencing persistent fever chills joint swelling over the last 2 weeks. Patient's symptoms started on 03/20. Patient had fever chills and bilateral knee joint pains. Denies any shortness of breath or productive cough at that time. Patient had called his PCP and since patient was unable to go to the clinic Tamiflu was called in. After taking Tamiflu patient started developing diarrhea and increasing joint swelling of the knees. Since the symptoms persisted patient came to the ER on 3/29 and had arthrocentesis of his left knee. Arthrocentesis fluid showed WBC of 6400. Up until now there has been no growth. Patient also has been having persistent diarrhea. Denies any sick contacts or recent travel. Patient also states over the last 24 hours has been having increasing weakness of the right upper and lower extremity due to pain involving the right shoulder and right knee.  He denies any previous history of arthritis or joint pains. No recent illnesses. He denies any dry eyes, dry mouth, mouth or nose ulcers, rash, chest pain, shortness of breath, cough, nausea or vomiting, abdominal pain. He has had loose diarrhea for the past week which is watery in nature, denies any melena or  hematochezia. He has no family history of rheumatologic disorders, denies any personal history of Crohn's or ulcerative colitis, IBD. He did have a colonoscopy years ago which was unremarkable. He denies any history of low back pain or stiffness. He has no other joint pains or stiffness. Prior to this, he was very active. His last trip was [place] in October. No sick contacts. He has right arm weakness that started 2 days ago, but not bilateral shoulder  weakness or stiffness.    Subjective:   Still has pain in both her knees worsened on ambulation. Reports some pain in his right shoulder with some weakness in the right arm.   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 Acute infectious arthritis is unlikely. Right knee arthrocentesis with 5.3K WBC, no crystals, cultures including pulmonary fungal cultures negative. AFB pending. ID and orthopedic consult appreciated. GC probe ordered and pending. Patient reports monogamous relation with wife. Blood cultures, urine cultures negative, CPK. Lyme titer negative. Rheumatoid factor, ANA, anti-DNA antibody, [place] spotted fever antibody were all negative. HLA-B27 antigen pending. Parvovirus ab was elevated but not sure of any clinical significance.  Discussed with ID. Agreed with trial of steroids. Will start on prednisone 60 mg daily. Patient referred to follow-up with rheumatologist Dr. Amil Amen as outpatient.  Right arm weakness Started few days back. MRI of the cervical spine shows a central and rightward disc extrusion at C6-7. Will consult neurosurgery in a.m. Does not have significant weakness on exam.  Diarrhea Stool studies were negative. Still reports 2-3 episodes of loose bowel movement.  Type 2 diabetes mellitus Monitor on SSI.  Hypertension Stable. Continue lisinopril.   Hyperlipidemia Continue statin.  GERD Continue PPI   Code Status :  Full code  Family Communication  : Wife at bedside  Disposition Plan  : Home tomorrow if symptoms better on prednisone  Barriers For Discharge : Active symptoms  Consults  :   Orthopedics ID  Procedures  :  MRI brain/cervical spine Right knee arthrocentesis  DVT Prophylaxis  :  Lovenox -   Lab Results  Component Value Date   PLT 438 (H) 11/16/2016    Antibiotics  :   Anti-infectives    None        Objective:   Vitals:   11/16/16 1500 11/16/16 2102 11/17/16 0644 11/17/16 0853  BP: (!) 150/98 (!) 150/95 136/76 138/85  Pulse: 79 79 80 82  Resp: _0 Temp: 98.9 F (37.2 C) 98.1 F (36.7 C) 98.5 F (36.9 C)   TempSrc:  Oral Oral   SpO2: 100% 94% 98% 98%    Wt Readings from Last 3 Encounters:  11/13/16 117.9 kg (260 lb)  11/11/16 113.4 kg (250 lb)  11/18/15 118.2 kg (260 lb 9.6 oz)     Intake/Output Summary (Last 24 hours) at 11/17/16 1441 Last data filed at 11/16/16 1800  Gross per 24 hour  Intake              120 ml  Output                0 ml  Net              120 ml     Physical Exam  Gen: not in distress HEENT: moist mucosa, supple neck Chest: clear b/l, no added sounds CVS: N S1&S2, no murmurs, GI: soft, NT, ND, Musculoskeletal: warm, No knee swellings, minimal tenderness to pressure,4+/5 power over proximal rt UE., Normal sensations     Data Review:    CBC  Recent Labs Lab 11/11/16 1239 11/14/16 1424 11/15/16 0549 11/16/16 0709  WBC 14.1* 10.0 8.7 8.3  HGB 12.0* 10.6* 10.5* 11.0*  HCT 36.2* 31.8* 32.8* 33.7*  PLT 326 378 386 438*  MCV 90.0 89.8 91.4 92.1  MCH 29.9 29.9 29.2 30.1  MCHC 33.1 33.3 32.0 32.6  RDW 13.5  14.0 14.0 13.9  LYMPHSABS 2.1 2.1  --   --   MONOABS 1.5* 0.9  --   --   EOSABS 0.1 0.1  --   --   BASOSABS 0.0 0.0  --   --     Chemistries   Recent Labs Lab 11/11/16 1239 11/14/16 1424 11/15/16 0549 11/16/16 0709  NA 139 139 141 137  K 4.1 3.8 4.1 4.0  CL 103 108 107 104  CO2 _0 GLUCOSE 118* 138* 100* 107*  BUN _1 CREATININE 0.90 0.86 0.75 0.75  CALCIUM 9.3 9.0 9.1 9.3  AST 24 27  --   --   ALT 40 48  --   --   ALKPHOS 74 82  --   --   BILITOT 1.6* 0.5  --   --    ------------------------------------------------------------------------------------------------------------------ No results for input(s): CHOL, HDL, LDLCALC, TRIG, CHOLHDL, LDLDIRECT in the last 72 hours.  No results found for: HGBA1C ------------------------------------------------------------------------------------------------------------------  Recent Labs  11/15/16 1426  TSH 0.621   ------------------------------------------------------------------------------------------------------------------  Recent Labs  11/15/16 1159  VITAMINB12 398  FOLATE 10.8  FERRITIN 601*  TIBC 272  IRON 21*  RETICCTPCT 1.2    Coagulation profile No results for input(s): INR, PROTIME in the last 168 hours.  No results for input(s): DDIMER in the last 72 hours.  Cardiac Enzymes No results for input(s): CKMB, TROPONINI, MYOGLOBIN in the last 168 hours.  Invalid input(s): CK ------------------------------------------------------------------------------------------------------------------ No results found for: BNP  Inpatient Medications  Scheduled Meds: . insulin aspart  0-9 Units Subcutaneous TID WC  . lisinopril  40 mg Oral Daily  . pantoprazole  40 mg Oral Daily  . predniSONE  60 mg Oral Daily  . simvastatin  40 mg Oral q morning - 10a   Continuous Infusions: PRN Meds:.acetaminophen **OR** acetaminophen, ibuprofen, ondansetron **OR** ondansetron (ZOFRAN) IV  Micro Results Recent Results (from the past 240 hour(s))  Body fluid culture     Status: None   Collection Time: 11/11/16  2:37 PM  Result Value Ref Range Status   Specimen Description SYNOVIAL KNEE  Final   Special Requests NONE  Final   Gram Stain   Final    ABUNDANT WBC PRESENT,BOTH PMN AND MONONUCLEAR NO ORGANISMS  SEEN    Culture NO GROWTH 3 DAYS  Final   Report Status 11/15/2016 FINAL  Final  Blood culture (routine x 2)     Status: None   Collection Time: 11/11/16  3:20 PM  Result Value Ref Range Status   Specimen Description BLOOD RIGHT ANTECUBITAL  Final   Special Requests BOTTLES DRAWN AEROBIC AND ANAEROBIC 5CC  Final   Culture NO GROWTH 5 DAYS  Final   Report Status 11/16/2016 FINAL  Final  Blood culture (routine x 2)     Status: None   Collection Time: 11/11/16  3:25 PM  Result Value Ref Range Status   Specimen Description BLOOD RIGHT HAND  Final   Special Requests BOTTLES DRAWN AEROBIC AND ANAEROBIC 5CC  Final   Culture NO GROWTH 5 DAYS  Final   Report Status 11/16/2016 FINAL  Final  Urine culture     Status: None   Collection Time: 11/14/16  4:25 PM  Result Value Ref Range Status   Specimen Description URINE, RANDOM  Final   Special Requests NONE  Final   Culture NO GROWTH  Final   Report Status 11/15/2016 FINAL  Final  Culture, blood (routine x  2)     Status: None (Preliminary result)   Collection Time: 11/14/16  5:56 PM  Result Value Ref Range Status   Specimen Description BLOOD RIGHT ANTECUBITAL  Final   Special Requests   Final    BOTTLES DRAWN AEROBIC AND ANAEROBIC Blood Culture adequate volume   Culture NO GROWTH 3 DAYS  Final   Report Status PENDING  Incomplete  Culture, blood (routine x 2)     Status: None (Preliminary result)   Collection Time: 11/14/16  9:49 PM  Result Value Ref Range Status   Specimen Description BLOOD LEFT ARM  Final   Special Requests IN PEDIATRIC BOTTLE Blood Culture adequate volume  Final   Culture NO GROWTH 3 DAYS  Final   Report Status PENDING  Incomplete  Gastrointestinal Panel by PCR , Stool     Status: None   Collection Time: 11/15/16 10:15 AM  Result Value Ref Range Status   Campylobacter species NOT DETECTED NOT DETECTED Final   Plesimonas shigelloides NOT DETECTED NOT DETECTED Final   Salmonella species NOT DETECTED NOT DETECTED Final    Yersinia enterocolitica NOT DETECTED NOT DETECTED Final   Vibrio species NOT DETECTED NOT DETECTED Final   Vibrio cholerae NOT DETECTED NOT DETECTED Final   Enteroaggregative E coli (EAEC) NOT DETECTED NOT DETECTED Final   Enteropathogenic E coli (EPEC) NOT DETECTED NOT DETECTED Final   Enterotoxigenic E coli (ETEC) NOT DETECTED NOT DETECTED Final   Shiga like toxin producing E coli (STEC) NOT DETECTED NOT DETECTED Final   Shigella/Enteroinvasive E coli (EIEC) NOT DETECTED NOT DETECTED Final   Cryptosporidium NOT DETECTED NOT DETECTED Final   Cyclospora cayetanensis NOT DETECTED NOT DETECTED Final   Entamoeba histolytica NOT DETECTED NOT DETECTED Final   Giardia lamblia NOT DETECTED NOT DETECTED Final   Adenovirus F40/41 NOT DETECTED NOT DETECTED Final   Astrovirus NOT DETECTED NOT DETECTED Final   Norovirus GI/GII NOT DETECTED NOT DETECTED Final   Rotavirus A NOT DETECTED NOT DETECTED Final   Sapovirus (I, II, IV, and V) NOT DETECTED NOT DETECTED Final  C difficile quick scan w PCR reflex     Status: None   Collection Time: 11/15/16 10:15 AM  Result Value Ref Range Status   C Diff antigen NEGATIVE NEGATIVE Final   C Diff toxin NEGATIVE NEGATIVE Final   C Diff interpretation No C. difficile detected.  Final  Gram stain     Status: None   Collection Time: 11/15/16  9:49 PM  Result Value Ref Range Status   Specimen Description SYNOVIAL  Final   Special Requests RIGHT KNEE  Final   Gram Stain   Final    FEW WBC PRESENT,BOTH PMN AND MONONUCLEAR NO ORGANISMS SEEN    Report Status 11/16/2016 FINAL  Final  Culture, fungus without smear     Status: None (Preliminary result)   Collection Time: 11/15/16  9:50 PM  Result Value Ref Range Status   Specimen Description SYNOVIAL RIGHT KNEE  Final   Special Requests NONE  Final   Culture NO FUNGUS ISOLATED AFTER 2 DAYS  Final   Report Status PENDING  Incomplete  Culture, body fluid-bottle     Status: None (Preliminary result)   Collection  Time: 11/15/16  9:50 PM  Result Value Ref Range Status   Specimen Description FLUID SYNOVIAL RIGHT KNEE  Final   Special Requests BOTTLES DRAWN AEROBIC AND ANAEROBIC 10CC  Final   Culture NO GROWTH 1 DAY  Final   Report Status PENDING  Incomplete    Radiology Reports Dg Chest 2 View  Result Date: 11/14/2016 CLINICAL DATA:  Fever EXAM: CHEST  2 VIEW COMPARISON:  11/11/2016 chest radiograph. FINDINGS: Stable cardiomediastinal silhouette with normal heart size. No pneumothorax. No pleural effusion. Lungs appear clear, with no acute consolidative airspace disease and no pulmonary edema. IMPRESSION: No active cardiopulmonary disease. Electronically Signed   By: Ilona Sorrel M.D.   On: 11/14/2016 17:48   Dg Chest 2 View  Result Date: 11/11/2016 CLINICAL DATA:  Fever EXAM: CHEST  2 VIEW COMPARISON:  11/18/2015 FINDINGS: Cardiac enlargement without heart failure. Lungs are clear without infiltrate effusion or mass. No change from the prior study. Thoracic osteophytes. IMPRESSION: No active cardiopulmonary disease. Electronically Signed   By: Franchot Gallo M.D.   On: 11/11/2016 15:12   Dg Abd 1 View  Result Date: 11/07/2016 CLINICAL DATA:  Abdominal bloating and tenderness EXAM: ABDOMEN - 1 VIEW COMPARISON:  None. FINDINGS: There is moderate stool throughout the colon. There is no bowel dilatation or air-fluid level suggesting bowel obstruction. No free air. There is a small phlebolith in the lower right pelvis. IMPRESSION: No bowel obstruction or free air.  Moderate stool in colon. Electronically Signed   By: Lowella Grip III M.D.   On: 11/07/2016 14:31   Ct Head Wo Contrast  Result Date: 11/14/2016 CLINICAL DATA:  Initial evaluation for acute headache, right-sided weakness. EXAM: CT HEAD WITHOUT CONTRAST TECHNIQUE: Contiguous axial images were obtained from the base of the skull through the vertex without intravenous contrast. COMPARISON:  Prior head CT from 01/05/2013. FINDINGS: Brain:  Encephalomalacia at the peripheral left temporal lobe is stable from prior, likely related to remote traumatic injury. Cerebral volume otherwise normal. No acute intracranial hemorrhage. No evidence for acute large vessel territory infarct. No mass lesion, midline shift or mass effect. No hydrocephalus. No extra-axial fluid collection. Vascular: No hyperdense vessel. Skull: Scalp soft tissues within normal limits.  Calvarium intact. Sinuses/Orbits: Globes and orbital soft tissues within normal limits. Mild scattered mucosal thickening within the ethmoidal air cells. Paranasal sinuses are otherwise clear. No mastoid effusion. Other: None. IMPRESSION: 1. No acute intracranial process. 2. 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 3. Otherwise negative head CT. Electronically Signed   By: Jeannine Boga M.D.   On: 11/14/2016 18:45   Mr Brain Wo Contrast  Result Date: 11/15/2016 CLINICAL DATA:  Joint pain and headaches. RIGHT arm weakness. History of [facility] spotted fever. EXAM: MRI HEAD WITHOUT CONTRAST MRI CERVICAL SPINE WITHOUT CONTRAST TECHNIQUE: Multiplanar, multiecho pulse sequences of the brain and surrounding structures, and cervical spine, to include the craniocervical junction and cervicothoracic junction, were obtained without intravenous contrast. COMPARISON:  CT head 11/14/2016.  CT head 01/05/2013. FINDINGS: MRI HEAD FINDINGS Brain: No evidence for acute infarction, hemorrhage, mass lesion, hydrocephalus, or extra-axial fluid. Normal for age cerebral volume, except for LEFT temporal encephalomalacia. Small craniectomy defect, as well as chronic blood products in this region, sequelae of treatment for what was reportedly a subdural hematoma. Minor white matter disease remote from the area of LEFT temporal brain injury, nonspecific. Vascular: Normal flow voids. Skull and upper cervical spine: Normal marrow signal.  Sinuses/Orbits: Negative. Other: None. MRI CERVICAL SPINE FINDINGS Alignment: Reversal of the normal cervical lordotic curve. No subluxation. Vertebrae: No worrisome osseous lesion. Cord: Cord compression without abnormal cord signal at C3-4, C5-6, and C6-7. Posterior Fossa, vertebral arteries, paraspinal tissues: Unremarkable. Disc levels: C2-3:  Normal. C3-4: Central and  leftward protrusion/disc osteophyte complex with LEFT-sided uncinate spurring. LEFT-sided cord flattening is prominent. Canal diameter 6-7 mm. LEFT C4 foraminal narrowing. C4-5: Annular bulge. RIGHT-sided uncinate spurring. RIGHT C5 foraminal narrowing. C5-6: Central and leftward protrusion/ disc osteophyte complex. Mild facet arthropathy. LEFT-sided cord flattening. Canal diameter 6-7 mm. LEFT C6 foraminal narrowing. C6-7: Central and rightward disc extrusion. Caudally migrated free fragment. Central and RIGHT-sided cord flattening. Canal diameter 6-7 mm. RIGHT greater than LEFT C7 foraminal narrowing. C7-T1:  Unremarkable. 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 No acute intracranial findings. No findings suggestive of rickettsial disease Motion degraded MRI cervical spine demonstrates a central and rightward disc extrusion at C6-7, caudally migrated free fragment. Correlate clinically for RIGHT C7 neural impingement contributing to RIGHT arm weakness. Cord flattening without abnormal cord signal at multiple levels. Electronically Signed   By: Staci Righter M.D.   On: 11/15/2016 09:23   Mr Cervical Spine Wo Contrast  Result Date: 11/15/2016 CLINICAL DATA:  Joint pain and headaches. RIGHT arm weakness. History of [facility] spotted fever. EXAM: MRI HEAD WITHOUT CONTRAST MRI CERVICAL SPINE WITHOUT CONTRAST TECHNIQUE: Multiplanar, multiecho pulse sequences of the brain and surrounding structures, and cervical spine, to include the  craniocervical junction and cervicothoracic junction, were obtained without intravenous contrast. COMPARISON:  CT head 11/14/2016.  CT head 01/05/2013. FINDINGS: MRI HEAD FINDINGS Brain: No evidence for acute infarction, hemorrhage, mass lesion, hydrocephalus, or extra-axial fluid. Normal for age cerebral volume, except for LEFT temporal encephalomalacia. Small craniectomy defect, as well as chronic blood products in this region, sequelae of treatment for what was reportedly a subdural hematoma. Minor white matter disease remote from the area of LEFT temporal brain injury, nonspecific. Vascular: Normal flow voids. Skull and upper cervical spine: Normal marrow signal. Sinuses/Orbits: Negative. Other: None. MRI CERVICAL SPINE FINDINGS Alignment: Reversal of the normal cervical lordotic curve. No subluxation. Vertebrae: No worrisome osseous lesion. Cord: Cord compression without abnormal cord signal at C3-4, C5-6, and C6-7. Posterior Fossa, vertebral arteries, paraspinal tissues: Unremarkable. Disc levels: C2-3:  Normal. C3-4: Central and leftward protrusion/disc osteophyte complex with LEFT-sided uncinate spurring. LEFT-sided cord flattening is prominent. Canal diameter 6-7 mm. LEFT C4 foraminal narrowing. C4-5: Annular bulge. RIGHT-sided uncinate spurring. RIGHT C5 foraminal narrowing. C5-6: Central and leftward protrusion/ disc osteophyte complex. Mild facet arthropathy. LEFT-sided cord flattening. Canal diameter 6-7 mm. LEFT C6 foraminal narrowing. C6-7: Central and rightward disc extrusion. Caudally migrated free fragment. Central and RIGHT-sided cord flattening. Canal diameter 6-7 mm. RIGHT greater than LEFT C7 foraminal narrowing. C7-T1:  Unremarkable. 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 No acute intracranial findings. No findings suggestive of rickettsial disease Motion degraded MRI cervical spine  demonstrates a central and rightward disc extrusion at C6-7, caudally migrated free fragment. Correlate clinically for RIGHT C7 neural impingement contributing to RIGHT arm weakness. Cord flattening without abnormal cord signal at multiple levels. Electronically Signed   By: Staci Righter M.D.   On: 11/15/2016 09:23    Time Spent in minutes  25   Louellen Molder M.D on 11/17/2016 at 2:41 PM  Between 7am to 7pm - Pager - [phone]  After 7pm go to [email or web address] - password [email or web address]  [facility] -  Office  [phone]

## 2016-11-17 NOTE — Progress Notes (Signed)
Subjective: No new complaints,   Antibiotics:  Anti-infectives    None      Medications: Scheduled Meds: . insulin aspart  0-9 Units Subcutaneous TID WC  . lisinopril  40 mg Oral Daily  . pantoprazole  40 mg Oral Daily  . predniSONE  60 mg Oral Daily  . simvastatin  40 mg Oral q morning - 10a   Continuous Infusions: PRN Meds:.acetaminophen **OR** acetaminophen, ibuprofen, ondansetron **OR** ondansetron (ZOFRAN) IV    Objective: Weight change:  No intake or output data in the 24 hours ending 11/17/16 1809 Blood pressure 138/85, pulse 82, temperature 98.5 F (36.9 C), temperature source Oral, resp. rate 18, SpO2 98 %. Temp:  [98.1 F (36.7 C)-98.5 F (36.9 C)] 98.5 F (36.9 C) (04/04 0644) Pulse Rate:  [79-82] 82 (04/04 0853) Resp:  [18] 18 (04/04 0644) BP: (136-150)/(76-95) 138/85 (04/04 0853) SpO2:  [94 %-98 %] 98 % (04/04 0853)  Physical Exam: General: Alert and awake, oriented x3, not in any acute distress. HEENT: anicteric sclera, , EOMI CVS regular rate, normal r,  no murmur rubs or gallops Chest: clear to auscultation bilaterally, no wheezing, rales or rhonchi Abdomen: soft nontender, nondistended, normal bowel sounds, Extremities better ROM tin both legs Skin: no rashes Neuro: nonfocal  CBC:  CBC Latest Ref Rng & Units 11/16/2016 11/15/2016 11/14/2016  WBC 4.0 - 10.5 K/uL 8.3 8.7 10.0  Hemoglobin 13.0 - 17.0 g/dL 11.0(L) 10.5(L) 10.6(L)  Hematocrit 39.0 - 52.0 % 33.7(L) 32.8(L) 31.8(L)  Platelets 150 - 400 K/uL 438(H) 386 378     BMET  Recent Labs  11/15/16 0549 11/16/16 0709  NA 141 137  K 4.1 4.0  CL 107 104  CO2 27 27  GLUCOSE 100* 107*  BUN 13 14  CREATININE 0.75 0.75  CALCIUM 9.1 9.3     Liver Panel  No results for input(s): PROT, ALBUMIN, AST, ALT, ALKPHOS, BILITOT, BILIDIR, IBILI in the last 72 hours.     Sedimentation Rate No results for input(s): ESRSEDRATE in the last 72 hours. C-Reactive Protein No results  for input(s): CRP in the last 72 hours.  Micro Results: Recent Results (from the past 720 hour(s))  Body fluid culture     Status: None   Collection Time: 11/11/16  2:37 PM  Result Value Ref Range Status   Specimen Description SYNOVIAL KNEE  Final   Special Requests NONE  Final   Gram Stain   Final    ABUNDANT WBC PRESENT,BOTH PMN AND MONONUCLEAR NO ORGANISMS SEEN    Culture NO GROWTH 3 DAYS  Final   Report Status 11/15/2016 FINAL  Final  Blood culture (routine x 2)     Status: None   Collection Time: 11/11/16  3:20 PM  Result Value Ref Range Status   Specimen Description BLOOD RIGHT ANTECUBITAL  Final   Special Requests BOTTLES DRAWN AEROBIC AND ANAEROBIC 5CC  Final   Culture NO GROWTH 5 DAYS  Final   Report Status 11/16/2016 FINAL  Final  Blood culture (routine x 2)     Status: None   Collection Time: 11/11/16  3:25 PM  Result Value Ref Range Status   Specimen Description BLOOD RIGHT HAND  Final   Special Requests BOTTLES DRAWN AEROBIC AND ANAEROBIC 5CC  Final   Culture NO GROWTH 5 DAYS  Final   Report Status 11/16/2016 FINAL  Final  Urine culture     Status: None   Collection Time: 11/14/16  4:25 PM  Result Value Ref Range Status   Specimen Description URINE, RANDOM  Final   Special Requests NONE  Final   Culture NO GROWTH  Final   Report Status 11/15/2016 FINAL  Final  Culture, blood (routine x 2)     Status: None (Preliminary result)   Collection Time: 11/14/16  5:56 PM  Result Value Ref Range Status   Specimen Description BLOOD RIGHT ANTECUBITAL  Final   Special Requests   Final    BOTTLES DRAWN AEROBIC AND ANAEROBIC Blood Culture adequate volume   Culture NO GROWTH 3 DAYS  Final   Report Status PENDING  Incomplete  Culture, blood (routine x 2)     Status: None (Preliminary result)   Collection Time: 11/14/16  9:49 PM  Result Value Ref Range Status   Specimen Description BLOOD LEFT ARM  Final   Special Requests IN PEDIATRIC BOTTLE Blood Culture adequate volume   Final   Culture NO GROWTH 3 DAYS  Final   Report Status PENDING  Incomplete  Gastrointestinal Panel by PCR , Stool     Status: None   Collection Time: 11/15/16 10:15 AM  Result Value Ref Range Status   Campylobacter species NOT DETECTED NOT DETECTED Final   Plesimonas shigelloides NOT DETECTED NOT DETECTED Final   Salmonella species NOT DETECTED NOT DETECTED Final   Yersinia enterocolitica NOT DETECTED NOT DETECTED Final   Vibrio species NOT DETECTED NOT DETECTED Final   Vibrio cholerae NOT DETECTED NOT DETECTED Final   Enteroaggregative E coli (EAEC) NOT DETECTED NOT DETECTED Final   Enteropathogenic E coli (EPEC) NOT DETECTED NOT DETECTED Final   Enterotoxigenic E coli (ETEC) NOT DETECTED NOT DETECTED Final   Shiga like toxin producing E coli (STEC) NOT DETECTED NOT DETECTED Final   Shigella/Enteroinvasive E coli (EIEC) NOT DETECTED NOT DETECTED Final   Cryptosporidium NOT DETECTED NOT DETECTED Final   Cyclospora cayetanensis NOT DETECTED NOT DETECTED Final   Entamoeba histolytica NOT DETECTED NOT DETECTED Final   Giardia lamblia NOT DETECTED NOT DETECTED Final   Adenovirus F40/41 NOT DETECTED NOT DETECTED Final   Astrovirus NOT DETECTED NOT DETECTED Final   Norovirus GI/GII NOT DETECTED NOT DETECTED Final   Rotavirus A NOT DETECTED NOT DETECTED Final   Sapovirus (I, II, IV, and V) NOT DETECTED NOT DETECTED Final  C difficile quick scan w PCR reflex     Status: None   Collection Time: 11/15/16 10:15 AM  Result Value Ref Range Status   C Diff antigen NEGATIVE NEGATIVE Final   C Diff toxin NEGATIVE NEGATIVE Final   C Diff interpretation No C. difficile detected.  Final  Gram stain     Status: None   Collection Time: 11/15/16  9:49 PM  Result Value Ref Range Status   Specimen Description SYNOVIAL  Final   Special Requests RIGHT KNEE  Final   Gram Stain   Final    FEW WBC PRESENT,BOTH PMN AND MONONUCLEAR NO ORGANISMS SEEN    Report Status 11/16/2016 FINAL  Final  Culture,  fungus without smear     Status: None (Preliminary result)   Collection Time: 11/15/16  9:50 PM  Result Value Ref Range Status   Specimen Description SYNOVIAL RIGHT KNEE  Final   Special Requests NONE  Final   Culture NO FUNGUS ISOLATED AFTER 2 DAYS  Final   Report Status PENDING  Incomplete  Culture, body fluid-bottle     Status: None (Preliminary result)   Collection Time: 11/15/16  9:50 PM  Result Value  Ref Range Status   Specimen Description FLUID SYNOVIAL RIGHT KNEE  Final   Special Requests BOTTLES DRAWN AEROBIC AND ANAEROBIC 10CC  Final   Culture NO GROWTH 1 DAY  Final   Report Status PENDING  Incomplete    Studies/Results: No results found.    Assessment/Plan:  INTERVAL HISTORY: repeat aspirate of oppposite knee performed by Dr Alvan Dame and Cell count very similar to prior aspirate  Cultures negative, FS negative, GC and chlamydia negative   Active Problems:   HLD (hyperlipidemia)   Inflammatory arthritis   Diabetes mellitus type 2, controlled ([facility])   FUO (fever of unknown origin)   Diarrhea   Essential hypertension   Polyarticular arthritis   Fever   Fatigue    Derrick Duncan is a 51 y.o. 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  #1 Inflammatory arthritis:  Greatly appreciate Dr. Aurea Graff help here  He has QUITE HIGH ESR  His repeat cell count is similar to prior one and NOT c/w bacterial pyogenic arthritis   No fungi isolated in first day. Repeat synovial fluid culture no growth, AFB smear not back yet but would be VERY unusual in this pt GC is negative from urine, OP and rectum  His HIV, hep panels are negative as is his GI pathogen panel  Agree with starting oral prednisone  I added labs for IBD and will add CMV and EBV serologies along  with coxsackie virus abs  Agree with getting him seen by Dr. Amil Amen with [facility]  I spent greater than 35 minutes with the patient including greater than 50% of time in face to face counsel of the patient re his inflammatory arthritis, labs done so far and in coordination of his care.  I will sign off for now please call with further questions.     LOS: 2 days   Alcide Evener 11/17/2016, 6:09 PM

## 2016-11-18 DIAGNOSIS — R197 Diarrhea, unspecified: Secondary | ICD-10-CM

## 2016-11-18 DIAGNOSIS — R5383 Other fatigue: Secondary | ICD-10-CM

## 2016-11-18 DIAGNOSIS — M5412 Radiculopathy, cervical region: Secondary | ICD-10-CM

## 2016-11-18 LAB — GLUCOSE, CAPILLARY: Glucose-Capillary: 110 mg/dL — ABNORMAL HIGH (ref 65–99)

## 2016-11-18 LAB — GC/CHLAMYDIA PROBE AMP (~~LOC~~) NOT AT ARMC
Chlamydia: NEGATIVE
Neisseria Gonorrhea: NEGATIVE

## 2016-11-18 LAB — HLA-B27 ANTIGEN: HLA-B27: NEGATIVE

## 2016-11-18 MED ORDER — PANTOPRAZOLE SODIUM 40 MG PO TBEC: 40.0000 mg | DELAYED_RELEASE_TABLET | Freq: Every day | ORAL | 0 refills | Status: DC

## 2016-11-18 MED ORDER — TRAMADOL-ACETAMINOPHEN 37.5-325 MG PO TABS: 1.0000 | ORAL_TABLET | Freq: Three times a day (TID) | ORAL | 0 refills | Status: DC | PRN

## 2016-11-18 MED ORDER — PREDNISONE 20 MG PO TABS: 60.0000 mg | ORAL_TABLET | Freq: Every day | ORAL | 0 refills | Status: DC

## 2016-11-18 NOTE — Progress Notes (Signed)
NURSING PROGRESS NOTE  Derrick Duncan [id number] Discharge Data: 11/18/2016 11:45 AM Attending Provider: Eddie North, MD WUJ:[id number],YNW, MD     Kari Baars to be D/C'd Home per MD order.  Discussed with the patient the After Visit Summary and all questions fully answered. All IV's discontinued with no bleeding noted. All belongings returned to patient for patient to take home.   Last Vital Signs:  Blood pressure 134/78, pulse 72, temperature 98.2 F (36.8 C), temperature source Oral, resp. rate 18, SpO2 98 %.  Discharge Medication List Allergies as of 11/18/2016   No Known Allergies     Medication List    STOP taking these medications   amoxicillin-clavulanate 875-125 MG tablet Commonly known as:  AUGMENTIN     TAKE these medications   aspirin 81 MG tablet Take 81 mg by mouth daily. Notes to patient:  Next dose due tomorrow   bismuth subsalicylate 262 MG chewable tablet Commonly known as:  PEPTO BISMOL Chew 524 mg by mouth as needed for indigestion.   ibuprofen 200 MG tablet Commonly known as:  ADVIL,MOTRIN Take 200 mg by mouth every 6 (six) hours as needed for moderate pain.   lisinopril 40 MG tablet Commonly known as:  PRINIVIL,ZESTRIL Take 40 mg by mouth daily. Notes to patient:  Next dose due tomorrow   metFORMIN 1000 MG tablet Commonly known as:  GLUCOPHAGE Take 0.5 tablets (500 mg total) by mouth 2 (two) times daily. What changed:  how much to take   pantoprazole 40 MG tablet Commonly known as:  PROTONIX Take 1 tablet (40 mg total) by mouth daily. Start taking on:  11/19/2016 Notes to patient:  Next dose due tomorrow   predniSONE 20 MG tablet Commonly known as:  DELTASONE Take 3 tablets (60 mg total) by mouth daily with breakfast. Notes to patient:  Next dose due tomorrow morning   simvastatin 40 MG tablet Commonly known as:  ZOCOR Take 40 mg by mouth every morning. Notes to patient:  Next dose due tomorrow   traMADol-acetaminophen 37.5-325  MG tablet Commonly known as:  ULTRACET Take 1 tablet by mouth every 8 (eight) hours as needed.        Roma Kayser, RN

## 2016-11-18 NOTE — Discharge Summary (Addendum)
Physician Discharge Summary  SOU NOHR BSJ:[id number] DOB: March 29, 1966 DOA: 11/14/2016  PCP: Asencion Noble, MD  Admit date: 11/14/2016 Discharge date: 11/18/2016  Admitted From: Home Disposition:  *Home  Recommendations for Outpatient Follow-up:  1. Follow up with PCP in 1 week 2. Patient should follow up with rheumatologist Dr Amil Amen in 1 -2 weeks 3. Follow up wth neurosurgery Dr Vertell Limber in 2 weeks.  Home Health:None Equipment/Devices: None   Discharge Condition: Fair CODE STATUS: Full code Diet recommendation: Diabetic   Discharge Diagnoses:  Principal Problem:   Polyarticular arthritis   Active Problems:   HLD (hyperlipidemia)   Inflammatory arthritis   Diabetes mellitus type 2, controlled (HCC)   FUO (fever of unknown origin)   Diarrhea   Essential hypertension   Fever   Fatigue   Brief narrative/history of present illness 51 y.o.malewith history of diabetes mellitus type 2, hypertension, hyperlipidemia has been experiencing persistent fever chills joint swelling over the last 2 weeks. Patient's symptoms started on 03/20. Patient had fever chills and bilateral knee joint pains. Denies any shortness of breath or productive cough at that time. Patient had called his PCP and since patient was unable to go to the clinic Tamiflu was called in. After taking Tamiflu patient started developing diarrhea and increasing joint swelling of the knees. Since the symptoms persisted patient came to the ER on 3/29 and had arthrocentesis of his left knee. Arthrocentesis fluid showed WBC of 6400. Up until now there has been no growth. Patient also has been having persistent diarrhea. Denies any sick contacts or recent travel. Patient also states over the last 24 hours has been having increasing weakness of the right upper and lower extremity due to pain involving the right shoulder and right knee.  He denies any previous history of arthritis or joint pains. No recent illnesses. He denies any  dry eyes, dry mouth, mouth or nose ulcers, rash, chest pain, shortness of breath, cough, nausea or vomiting, abdominal pain. He has had loose diarrhea for the past week which is watery in nature, denies any melena or hematochezia. He has no family history of rheumatologic disorders, denies any personal history of Crohn's or ulcerative colitis, IBD. He did have a colonoscopy years ago which was unremarkable. He denies any history of low back pain or stiffness. He has no other joint pains or stiffness. Prior to this, he was very active. His last trip was [place] in October. No sick contacts. He has right arm weakness that started 2 days ago, but not bilateral shoulder weakness or stiffness.   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 Right knee arthrocentesis with 5.3K WBC, no crystals, cultures including  fungal cultures and AFB negative. GC probe negative. ID and orthopedic consult appreciated.  Blood and urine culture negative for growth. Normal CPK Lyme titer negative. Rheumatoid factor, ANA, CCP anti-DNA antibody, [place] spotted fever antibody , HLA-B27 were all negative.  Parvovirus ab was elevated but not sure of any clinical significance. Labs sent for myeloperoxidase antibody, EBV and CMV antibody and should be followed as outpatient.  Discussed with ID. Agreed with trial of steroids. Will start on prednisone 60 mg daily with plan to taper over the next few weeks (60 mg daily for 7 days then 40 mg daily for next 7 days to taper by 10 mg daily until seen by rheumatologist). Added PPI.  No follow-up needed for PT/OT.  Patient referred to follow-up with  rheumatologist Dr. Amil Amen as outpatient.  Right arm weakness with C7 radiculopathy Started few days back. MRI of the cervical spine shows a central and rightward disc extrusion at C6-7. Discussed with neurosurgery Dr. Vertell Limber who  recommended outpatient follow-up.  Diarrhea Stool studies were negative. Still reports about 2 episodes of loose bowel movement. Stool studies have been negative. Follow-up as outpatient.  Type 2 diabetes mellitus Resume metformin.  Hypertension Stable. Continue lisinopril.   Hyperlipidemia Continue statin.     Family Communication  :  none at bedside today  Disposition Plan  : Home   Consults  :   Orthopedics ID  Procedures  :  MRI brain/cervical spine Right knee arthrocentesis     Discharge Instructions   Allergies as of 11/18/2016   No Known Allergies     Medication List    STOP taking these medications   amoxicillin-clavulanate 875-125 MG tablet Commonly known as:  AUGMENTIN     TAKE these medications   aspirin 81 MG tablet Take 81 mg by mouth daily.   bismuth subsalicylate 032 MG chewable tablet Commonly known as:  PEPTO BISMOL Chew 524 mg by mouth as needed for indigestion.   ibuprofen 200 MG tablet Commonly known as:  ADVIL,MOTRIN Take 200 mg by mouth every 6 (six) hours as needed for moderate pain.   lisinopril 40 MG tablet Commonly known as:  PRINIVIL,ZESTRIL Take 40 mg by mouth daily.   metFORMIN 1000 MG tablet Commonly known as:  GLUCOPHAGE Take 0.5 tablets (500 mg total) by mouth 2 (two) times daily. What changed:  how much to take   pantoprazole 40 MG tablet Commonly known as:  PROTONIX Take 1 tablet (40 mg total) by mouth daily. Start taking on:  11/19/2016   predniSONE 20 MG tablet Commonly known as:  DELTASONE Take 3 tablets (60 mg total) by mouth daily with breakfast.   simvastatin 40 MG tablet Commonly known as:  ZOCOR Take 40 mg by mouth every morning.   traMADol-acetaminophen 37.5-325 MG tablet Commonly known as:  ULTRACET Take 1 tablet by mouth every 8 (eight) hours as needed.      Follow-up Information    FAGAN,ROY, MD. Schedule an appointment as soon as possible for a visit in 1 week(s).    Specialty:  Internal Medicine Contact information: [address] [place] [address] [phone]        Hennie Duos, MD. Schedule an appointment as soon as possible for a visit in 1 week(s).   Specialty:  Rheumatology Contact information: [place] [address] [phone]        Peggyann Shoals, MD. Schedule an appointment as soon as possible for a visit in 2 week(s).   Specialty:  Neurosurgery Contact information: [address] [place] [address] [phone]          No Known Allergies   Procedures/Studies: Dg Chest 2 View  Result Date: 11/14/2016 CLINICAL DATA:  Fever EXAM: CHEST  2 VIEW COMPARISON:  11/11/2016 chest radiograph. FINDINGS: Stable cardiomediastinal silhouette with normal heart size. No pneumothorax. No pleural effusion. Lungs appear clear, with no acute consolidative airspace disease and no pulmonary edema. IMPRESSION: No active cardiopulmonary disease. Electronically Signed   By: Ilona Sorrel M.D.   On: 11/14/2016 17:48   Dg Chest 2 View  Result Date: 11/11/2016 CLINICAL DATA:  Fever EXAM: CHEST  2 VIEW COMPARISON:  11/18/2015 FINDINGS: Cardiac enlargement without heart failure. Lungs are clear without infiltrate  effusion or mass. No change from the prior study. Thoracic osteophytes. IMPRESSION: No active cardiopulmonary disease. Electronically Signed   By: Franchot Gallo M.D.   On: 11/11/2016 15:12   Dg Abd 1 View  Result Date: 11/07/2016 CLINICAL DATA:  Abdominal bloating and tenderness EXAM: ABDOMEN - 1 VIEW COMPARISON:  None. FINDINGS: There is moderate stool throughout the colon. There is no bowel dilatation or air-fluid level suggesting bowel obstruction. No free air. There is a small phlebolith in the lower right pelvis. IMPRESSION: No bowel obstruction or free air.  Moderate stool in colon. Electronically Signed   By: Lowella Grip III M.D.   On: 11/07/2016 14:31   Ct Head  Wo Contrast  Result Date: 11/14/2016 CLINICAL DATA:  Initial evaluation for acute headache, right-sided weakness. EXAM: CT HEAD WITHOUT CONTRAST TECHNIQUE: Contiguous axial images were obtained from the base of the skull through the vertex without intravenous contrast. COMPARISON:  Prior head CT from 01/05/2013. 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 Cerebral volume otherwise normal. No acute intracranial hemorrhage. No evidence for acute large vessel territory infarct. No mass lesion, midline shift or mass effect. No hydrocephalus. No extra-axial fluid collection. Vascular: No hyperdense vessel. Skull: Scalp soft tissues within normal limits.  Calvarium intact. Sinuses/Orbits: Globes and orbital soft tissues within normal limits. Mild scattered mucosal thickening within the ethmoidal air cells. Paranasal sinuses are otherwise clear. No mastoid effusion. Other: None. IMPRESSION: 1. No acute intracranial process. 2. 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 3. Otherwise negative head CT. Electronically Signed   By: Jeannine Boga M.D.   On: 11/14/2016 18:45   Mr Brain Wo Contrast  Result Date: 11/15/2016 CLINICAL DATA:  Joint pain and headaches. RIGHT arm weakness. History of [facility] spotted fever. EXAM: MRI HEAD WITHOUT CONTRAST MRI CERVICAL SPINE WITHOUT CONTRAST TECHNIQUE: Multiplanar, multiecho pulse sequences of the brain and surrounding structures, and cervical spine, to include the craniocervical junction and cervicothoracic junction, were obtained without intravenous contrast. COMPARISON:  CT head 11/14/2016.  CT head 01/05/2013. FINDINGS: MRI HEAD FINDINGS Brain: No evidence for acute infarction, hemorrhage, mass lesion, hydrocephalus, or extra-axial fluid. Normal for age cerebral volume, except for LEFT temporal  encephalomalacia. Small craniectomy defect, as well as chronic blood products in this region, sequelae of treatment for what was reportedly a subdural hematoma. Minor white matter disease remote from the area of LEFT temporal brain injury, nonspecific. Vascular: Normal flow voids. Skull and upper cervical spine: Normal marrow signal. Sinuses/Orbits: Negative. Other: None. MRI CERVICAL SPINE FINDINGS Alignment: Reversal of the normal cervical lordotic curve. No subluxation. Vertebrae: No worrisome osseous lesion. Cord: Cord compression without abnormal cord signal at C3-4, C5-6, and C6-7. Posterior Fossa, vertebral arteries, paraspinal tissues: Unremarkable. Disc levels: C2-3:  Normal. C3-4: Central and leftward protrusion/disc osteophyte complex with LEFT-sided uncinate spurring. LEFT-sided cord flattening is prominent. Canal diameter 6-7 mm. LEFT C4 foraminal narrowing. C4-5: Annular bulge. RIGHT-sided uncinate spurring. RIGHT C5 foraminal narrowing. C5-6: Central and leftward protrusion/ disc osteophyte complex. Mild facet arthropathy. LEFT-sided cord flattening. Canal diameter 6-7 mm. LEFT C6 foraminal narrowing. C6-7: Central and rightward disc extrusion. Caudally migrated free fragment. Central and RIGHT-sided cord flattening. Canal diameter 6-7 mm. RIGHT greater than LEFT C7 foraminal narrowing. C7-T1:  Unremarkable. 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 No acute intracranial findings.  No findings suggestive of rickettsial disease Motion degraded MRI cervical spine demonstrates a central and rightward disc extrusion at C6-7, caudally migrated free fragment. Correlate clinically for RIGHT C7 neural impingement contributing to RIGHT arm weakness. Cord flattening without abnormal cord signal at multiple levels. Electronically Signed   By: Staci Righter M.D.   On: 11/15/2016 09:23   Mr Cervical Spine Wo  Contrast  Result Date: 11/15/2016 CLINICAL DATA:  Joint pain and headaches. RIGHT arm weakness. History of [facility] spotted fever. EXAM: MRI HEAD WITHOUT CONTRAST MRI CERVICAL SPINE WITHOUT CONTRAST TECHNIQUE: Multiplanar, multiecho pulse sequences of the brain and surrounding structures, and cervical spine, to include the craniocervical junction and cervicothoracic junction, were obtained without intravenous contrast. COMPARISON:  CT head 11/14/2016.  CT head 01/05/2013. FINDINGS: MRI HEAD FINDINGS Brain: No evidence for acute infarction, hemorrhage, mass lesion, hydrocephalus, or extra-axial fluid. Normal for age cerebral volume, except for LEFT temporal encephalomalacia. Small craniectomy defect, as well as chronic blood products in this region, sequelae of treatment for what was reportedly a subdural hematoma. Minor white matter disease remote from the area of LEFT temporal brain injury, nonspecific. Vascular: Normal flow voids. Skull and upper cervical spine: Normal marrow signal. Sinuses/Orbits: Negative. Other: None. MRI CERVICAL SPINE FINDINGS Alignment: Reversal of the normal cervical lordotic curve. No subluxation. Vertebrae: No worrisome osseous lesion. Cord: Cord compression without abnormal cord signal at C3-4, C5-6, and C6-7. Posterior Fossa, vertebral arteries, paraspinal tissues: Unremarkable. Disc levels: C2-3:  Normal. C3-4: Central and leftward protrusion/disc osteophyte complex with LEFT-sided uncinate spurring. LEFT-sided cord flattening is prominent. Canal diameter 6-7 mm. LEFT C4 foraminal narrowing. C4-5: Annular bulge. RIGHT-sided uncinate spurring. RIGHT C5 foraminal narrowing. C5-6: Central and leftward protrusion/ disc osteophyte complex. Mild facet arthropathy. LEFT-sided cord flattening. Canal diameter 6-7 mm. LEFT C6 foraminal narrowing. C6-7: Central and rightward disc extrusion. Caudally migrated free fragment. Central and RIGHT-sided cord flattening. Canal diameter 6-7 mm.  RIGHT greater than LEFT C7 foraminal narrowing. C7-T1:  Unremarkable. 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 No acute intracranial findings. No findings suggestive of rickettsial disease Motion degraded MRI cervical spine demonstrates a central and rightward disc extrusion at C6-7, caudally migrated free fragment. Correlate clinically for RIGHT C7 neural impingement contributing to RIGHT arm weakness. Cord flattening without abnormal cord signal at multiple levels. Electronically Signed   By: Staci Righter M.D.   On: 11/15/2016 09:23       Subjective: Knee pain improved after starting steroid yesterday. Still has mild weakness and pain in his right arm.  Discharge Exam: Vitals:   11/17/16 2107 11/18/16 0430  BP: 121/88 134/78  Pulse: 66 72  Resp: 20 18  Temp: 97.9 F (36.6 C) 98.2 F (36.8 C)   Vitals:   11/17/16 0644 11/17/16 0853 11/17/16 2107 11/18/16 0430  BP: 136/76 138/85 121/88 134/78  Pulse: 80 82 66 72  Resp: 18  20 18   Temp: 98.5 F (36.9 C)  97.9 F (36.6 C) 98.2 F (36.8 C)  TempSrc: Oral  Oral Oral  SpO2: 98% 98% 96% 98%    Gen: not in distress HEENT: moist mucosa, supple neck Chest: clear b/l, no added sounds CVS: N S1&S2, no murmurs, GI: soft, NT, ND, Musculoskeletal: warm, No knee swellings, minimal tenderness to pressure,4+/5 power over proximal rt UE., Normal sensations     The results of significant diagnostics from this hospitalization (including imaging, microbiology, ancillary and laboratory) are  listed below for reference.     Microbiology: Recent Results (from the past 240 hour(s))  Body fluid culture     Status: None   Collection Time: 11/11/16  2:37 PM  Result Value Ref Range Status   Specimen Description SYNOVIAL KNEE  Final   Special Requests NONE  Final   Gram Stain   Final    ABUNDANT WBC PRESENT,BOTH PMN AND MONONUCLEAR NO  ORGANISMS SEEN    Culture NO GROWTH 3 DAYS  Final   Report Status 11/15/2016 FINAL  Final  Blood culture (routine x 2)     Status: None   Collection Time: 11/11/16  3:20 PM  Result Value Ref Range Status   Specimen Description BLOOD RIGHT ANTECUBITAL  Final   Special Requests BOTTLES DRAWN AEROBIC AND ANAEROBIC 5CC  Final   Culture NO GROWTH 5 DAYS  Final   Report Status 11/16/2016 FINAL  Final  Blood culture (routine x 2)     Status: None   Collection Time: 11/11/16  3:25 PM  Result Value Ref Range Status   Specimen Description BLOOD RIGHT HAND  Final   Special Requests BOTTLES DRAWN AEROBIC AND ANAEROBIC 5CC  Final   Culture NO GROWTH 5 DAYS  Final   Report Status 11/16/2016 FINAL  Final  Urine culture     Status: None   Collection Time: 11/14/16  4:25 PM  Result Value Ref Range Status   Specimen Description URINE, RANDOM  Final   Special Requests NONE  Final   Culture NO GROWTH  Final   Report Status 11/15/2016 FINAL  Final  Culture, blood (routine x 2)     Status: None (Preliminary result)   Collection Time: 11/14/16  5:56 PM  Result Value Ref Range Status   Specimen Description BLOOD RIGHT ANTECUBITAL  Final   Special Requests   Final    BOTTLES DRAWN AEROBIC AND ANAEROBIC Blood Culture adequate volume   Culture NO GROWTH 3 DAYS  Final   Report Status PENDING  Incomplete  Culture, blood (routine x 2)     Status: None (Preliminary result)   Collection Time: 11/14/16  9:49 PM  Result Value Ref Range Status   Specimen Description BLOOD LEFT ARM  Final   Special Requests IN PEDIATRIC BOTTLE Blood Culture adequate volume  Final   Culture NO GROWTH 3 DAYS  Final   Report Status PENDING  Incomplete  Gastrointestinal Panel by PCR , Stool     Status: None   Collection Time: 11/15/16 10:15 AM  Result Value Ref Range Status   Campylobacter species NOT DETECTED NOT DETECTED Final   Plesimonas shigelloides NOT DETECTED NOT DETECTED Final   Salmonella species NOT DETECTED NOT  DETECTED Final   Yersinia enterocolitica NOT DETECTED NOT DETECTED Final   Vibrio species NOT DETECTED NOT DETECTED Final   Vibrio cholerae NOT DETECTED NOT DETECTED Final   Enteroaggregative E coli (EAEC) NOT DETECTED NOT DETECTED Final   Enteropathogenic E coli (EPEC) NOT DETECTED NOT DETECTED Final   Enterotoxigenic E coli (ETEC) NOT DETECTED NOT DETECTED Final   Shiga like toxin producing E coli (STEC) NOT DETECTED NOT DETECTED Final   Shigella/Enteroinvasive E coli (EIEC) NOT DETECTED NOT DETECTED Final   Cryptosporidium NOT DETECTED NOT DETECTED Final   Cyclospora cayetanensis NOT DETECTED NOT DETECTED Final   Entamoeba histolytica NOT DETECTED NOT DETECTED Final   Giardia lamblia NOT DETECTED NOT DETECTED Final   Adenovirus F40/41 NOT DETECTED NOT DETECTED Final   Astrovirus NOT DETECTED  NOT DETECTED Final   Norovirus GI/GII NOT DETECTED NOT DETECTED Final   Rotavirus A NOT DETECTED NOT DETECTED Final   Sapovirus (I, II, IV, and V) NOT DETECTED NOT DETECTED Final  C difficile quick scan w PCR reflex     Status: None   Collection Time: 11/15/16 10:15 AM  Result Value Ref Range Status   C Diff antigen NEGATIVE NEGATIVE Final   C Diff toxin NEGATIVE NEGATIVE Final   C Diff interpretation No C. difficile detected.  Final  Gram stain     Status: None   Collection Time: 11/15/16  9:49 PM  Result Value Ref Range Status   Specimen Description SYNOVIAL  Final   Special Requests RIGHT KNEE  Final   Gram Stain   Final    FEW WBC PRESENT,BOTH PMN AND MONONUCLEAR NO ORGANISMS SEEN    Report Status 11/16/2016 FINAL  Final  Culture, fungus without smear     Status: None (Preliminary result)   Collection Time: 11/15/16  9:50 PM  Result Value Ref Range Status   Specimen Description SYNOVIAL RIGHT KNEE  Final   Special Requests NONE  Final   Culture NO FUNGUS ISOLATED AFTER 2 DAYS  Final   Report Status PENDING  Incomplete  Culture, body fluid-bottle     Status: None (Preliminary  result)   Collection Time: 11/15/16  9:50 PM  Result Value Ref Range Status   Specimen Description FLUID SYNOVIAL RIGHT KNEE  Final   Special Requests BOTTLES DRAWN AEROBIC AND ANAEROBIC 10CC  Final   Culture NO GROWTH 1 DAY  Final   Report Status PENDING  Incomplete  Acid Fast Smear (AFB)     Status: None (Preliminary result)   Collection Time: 11/15/16  9:50 PM  Result Value Ref Range Status   AFB Specimen Processing Concentration  Final   Acid Fast Smear Negative  Final    Comment: (NOTE) Performed At: [facility] [place], [place] [id number] Lindon Romp MD XQ:[id number]    Source (AFB) PENDING  Incomplete     Labs: BNP (last 3 results) No results for input(s): BNP in the last 8760 hours. Basic Metabolic Panel:  Recent Labs Lab 11/11/16 1239 11/14/16 1424 11/15/16 0549 11/16/16 0709  NA 139 139 141 137  K 4.1 3.8 4.1 4.0  CL 103 108 107 104  CO2 29 25 27 27   GLUCOSE 118* 138* 100* 107*  BUN 7 17 13 14   CREATININE 0.90 0.86 0.75 0.75  CALCIUM 9.3 9.0 9.1 9.3   Liver Function Tests:  Recent Labs Lab 11/11/16 1239 11/14/16 1424  AST 24 27  ALT 40 48  ALKPHOS 74 82  BILITOT 1.6* 0.5  PROT 6.9 6.6  ALBUMIN 3.2* 3.0*   No results for input(s): LIPASE, AMYLASE in the last 168 hours. No results for input(s): AMMONIA in the last 168 hours. CBC:  Recent Labs Lab 11/11/16 1239 11/14/16 1424 11/15/16 0549 11/16/16 0709  WBC 14.1* 10.0 8.7 8.3  NEUTROABS 10.3* 6.9  --   --   HGB 12.0* 10.6* 10.5* 11.0*  HCT 36.2* 31.8* 32.8* 33.7*  MCV 90.0 89.8 91.4 92.1  PLT 326 378 386 438*   Cardiac Enzymes:  Recent Labs Lab 11/14/16 2206  CKTOTAL 86   BNP: Invalid input(s): POCBNP CBG:  Recent Labs Lab 11/17/16 0642 11/17/16 1125 11/17/16 1620 11/17/16 2354 11/18/16 0634  GLUCAP 99 75 129* 141* 110*   D-Dimer No results for input(s): DDIMER in the last 72  hours. Hgb A1c No results for input(s): HGBA1C in the last 72  hours. Lipid Profile No results for input(s): CHOL, HDL, LDLCALC, TRIG, CHOLHDL, LDLDIRECT in the last 72 hours. Thyroid function studies  Recent Labs  11/15/16 1426  TSH 0.621   Anemia work up  Recent Labs  11/15/16 1159  VITAMINB12 398  FOLATE 10.8  FERRITIN 601*  TIBC 272  IRON 21*  RETICCTPCT 1.2   Urinalysis    Component Value Date/Time   COLORURINE YELLOW 11/14/2016 1625   APPEARANCEUR CLEAR 11/14/2016 1625   LABSPEC 1.030 11/14/2016 1625   PHURINE 5.0 11/14/2016 1625   GLUCOSEU 50 (A) 11/14/2016 1625   HGBUR NEGATIVE 11/14/2016 1625   BILIRUBINUR NEGATIVE 11/14/2016 1625   KETONESUR NEGATIVE 11/14/2016 1625   PROTEINUR NEGATIVE 11/14/2016 1625   UROBILINOGEN 0.2 02/09/2015 1500   NITRITE NEGATIVE 11/14/2016 1625   LEUKOCYTESUR NEGATIVE 11/14/2016 1625   Sepsis Labs Invalid input(s): PROCALCITONIN,  WBC,  LACTICIDVEN Microbiology Recent Results (from the past 240 hour(s))  Body fluid culture     Status: None   Collection Time: 11/11/16  2:37 PM  Result Value Ref Range Status   Specimen Description SYNOVIAL KNEE  Final   Special Requests NONE  Final   Gram Stain   Final    ABUNDANT WBC PRESENT,BOTH PMN AND MONONUCLEAR NO ORGANISMS SEEN    Culture NO GROWTH 3 DAYS  Final   Report Status 11/15/2016 FINAL  Final  Blood culture (routine x 2)     Status: None   Collection Time: 11/11/16  3:20 PM  Result Value Ref Range Status   Specimen Description BLOOD RIGHT ANTECUBITAL  Final   Special Requests BOTTLES DRAWN AEROBIC AND ANAEROBIC 5CC  Final   Culture NO GROWTH 5 DAYS  Final   Report Status 11/16/2016 FINAL  Final  Blood culture (routine x 2)     Status: None   Collection Time: 11/11/16  3:25 PM  Result Value Ref Range Status   Specimen Description BLOOD RIGHT HAND  Final   Special Requests BOTTLES DRAWN AEROBIC AND ANAEROBIC 5CC  Final   Culture NO GROWTH 5 DAYS  Final   Report Status 11/16/2016 FINAL  Final  Urine culture     Status: None    Collection Time: 11/14/16  4:25 PM  Result Value Ref Range Status   Specimen Description URINE, RANDOM  Final   Special Requests NONE  Final   Culture NO GROWTH  Final   Report Status 11/15/2016 FINAL  Final  Culture, blood (routine x 2)     Status: None (Preliminary result)   Collection Time: 11/14/16  5:56 PM  Result Value Ref Range Status   Specimen Description BLOOD RIGHT ANTECUBITAL  Final   Special Requests   Final    BOTTLES DRAWN AEROBIC AND ANAEROBIC Blood Culture adequate volume   Culture NO GROWTH 3 DAYS  Final   Report Status PENDING  Incomplete  Culture, blood (routine x 2)     Status: None (Preliminary result)   Collection Time: 11/14/16  9:49 PM  Result Value Ref Range Status   Specimen Description BLOOD LEFT ARM  Final   Special Requests IN PEDIATRIC BOTTLE Blood Culture adequate volume  Final   Culture NO GROWTH 3 DAYS  Final   Report Status PENDING  Incomplete  Gastrointestinal Panel by PCR , Stool     Status: None   Collection Time: 11/15/16 10:15 AM  Result Value Ref Range Status   Campylobacter species NOT  DETECTED NOT DETECTED Final   Plesimonas shigelloides NOT DETECTED NOT DETECTED Final   Salmonella species NOT DETECTED NOT DETECTED Final   Yersinia enterocolitica NOT DETECTED NOT DETECTED Final   Vibrio species NOT DETECTED NOT DETECTED Final   Vibrio cholerae NOT DETECTED NOT DETECTED Final   Enteroaggregative E coli (EAEC) NOT DETECTED NOT DETECTED Final   Enteropathogenic E coli (EPEC) NOT DETECTED NOT DETECTED Final   Enterotoxigenic E coli (ETEC) NOT DETECTED NOT DETECTED Final   Shiga like toxin producing E coli (STEC) NOT DETECTED NOT DETECTED Final   Shigella/Enteroinvasive E coli (EIEC) NOT DETECTED NOT DETECTED Final   Cryptosporidium NOT DETECTED NOT DETECTED Final   Cyclospora cayetanensis NOT DETECTED NOT DETECTED Final   Entamoeba histolytica NOT DETECTED NOT DETECTED Final   Giardia lamblia NOT DETECTED NOT DETECTED Final   Adenovirus  F40/41 NOT DETECTED NOT DETECTED Final   Astrovirus NOT DETECTED NOT DETECTED Final   Norovirus GI/GII NOT DETECTED NOT DETECTED Final   Rotavirus A NOT DETECTED NOT DETECTED Final   Sapovirus (I, II, IV, and V) NOT DETECTED NOT DETECTED Final  C difficile quick scan w PCR reflex     Status: None   Collection Time: 11/15/16 10:15 AM  Result Value Ref Range Status   C Diff antigen NEGATIVE NEGATIVE Final   C Diff toxin NEGATIVE NEGATIVE Final   C Diff interpretation No C. difficile detected.  Final  Gram stain     Status: None   Collection Time: 11/15/16  9:49 PM  Result Value Ref Range Status   Specimen Description SYNOVIAL  Final   Special Requests RIGHT KNEE  Final   Gram Stain   Final    FEW WBC PRESENT,BOTH PMN AND MONONUCLEAR NO ORGANISMS SEEN    Report Status 11/16/2016 FINAL  Final  Culture, fungus without smear     Status: None (Preliminary result)   Collection Time: 11/15/16  9:50 PM  Result Value Ref Range Status   Specimen Description SYNOVIAL RIGHT KNEE  Final   Special Requests NONE  Final   Culture NO FUNGUS ISOLATED AFTER 2 DAYS  Final   Report Status PENDING  Incomplete  Culture, body fluid-bottle     Status: None (Preliminary result)   Collection Time: 11/15/16  9:50 PM  Result Value Ref Range Status   Specimen Description FLUID SYNOVIAL RIGHT KNEE  Final   Special Requests BOTTLES DRAWN AEROBIC AND ANAEROBIC 10CC  Final   Culture NO GROWTH 1 DAY  Final   Report Status PENDING  Incomplete  Acid Fast Smear (AFB)     Status: None (Preliminary result)   Collection Time: 11/15/16  9:50 PM  Result Value Ref Range Status   AFB Specimen Processing Concentration  Final   Acid Fast Smear Negative  Final    Comment: (NOTE) Performed At: [facility] [place], [place] [id number] Lindon Romp MD LA:[id number]    Source (AFB) PENDING  Incomplete     Time coordinating discharge: Over 30 minutes  SIGNED:   Louellen Molder, MD  [facility] 11/18/2016, 10:28 AM Pager   If 7PM-7AM, please contact night-coverage [email or web address] Password [email or web address]

## 2016-11-19 LAB — COXSACKIE A VIRUS ANTIBODIES
COXSACKIE A24 IGM: NEGATIVE {titer}
Coxsackie A16 IgG: 1:800 {titer} — ABNORMAL HIGH
Coxsackie A16 IgM: NEGATIVE titer
Coxsackie A24 IgG: 1:400 {titer} — ABNORMAL HIGH
Coxsackie A7 IgM: NEGATIVE titer
Coxsackie A9 IgG: 1:800 {titer} — ABNORMAL HIGH
Coxsackie A9 IgM: NEGATIVE titer

## 2016-11-19 LAB — CULTURE, BLOOD (ROUTINE X 2)
CULTURE: NO GROWTH
Culture: NO GROWTH
Special Requests: ADEQUATE
Special Requests: ADEQUATE

## 2016-11-19 LAB — MPO/PR-3 (ANCA) ANTIBODIES: Myeloperoxidase Abs: <9 U/mL (ref 0.0–9.0)

## 2016-11-19 LAB — EPSTEIN-BARR VIRUS VCA ANTIBODY PANEL
EBV NA IgG: <18 U/mL (ref 0.0–17.9)
EBV VCA IgG: 434 U/mL — ABNORMAL HIGH (ref 0.0–17.9)
EBV VCA IgM: <36 U/mL (ref 0.0–35.9)

## 2016-11-19 LAB — CMV IGM: CMV IgM: <30 AU/mL (ref 0.0–29.9)

## 2016-11-19 LAB — CMV ANTIBODY, IGG (EIA): CMV AB - IGG: 3.5 U/mL — AB (ref 0.00–0.59)

## 2016-11-20 LAB — COXSACKIE B VIRUS ANTIBODIES
COXSACKIE B3 AB: NEGATIVE
COXSACKIE B4 AB: NEGATIVE
Coxsackie B2 Ab: 1:8 {titer} — ABNORMAL HIGH
Coxsackie B6 Ab: 1:8 {titer} — ABNORMAL HIGH

## 2016-11-21 LAB — CULTURE, BODY FLUID W GRAM STAIN -BOTTLE

## 2016-11-21 LAB — CULTURE, BODY FLUID-BOTTLE: CULTURE: NO GROWTH

## 2016-11-30 LAB — ACID FAST SMEAR (AFB): ACID FAST SMEAR - AFSCU2: NEGATIVE

## 2016-12-06 LAB — CULTURE, FUNGUS WITHOUT SMEAR

## 2016-12-29 LAB — ACID FAST CULTURE WITH REFLEXED SENSITIVITIES (MYCOBACTERIA): Acid Fast Culture: NEGATIVE

## 2017-02-08 ENCOUNTER — Ambulatory Visit: Payer: BC Managed Care – PPO | Admitting: Neurology

## 2017-03-30 ENCOUNTER — Ambulatory Visit: Payer: BC Managed Care – PPO | Admitting: Neurology

## 2017-05-05 ENCOUNTER — Encounter: Payer: Self-pay | Admitting: Neurology

## 2017-05-05 ENCOUNTER — Ambulatory Visit (INDEPENDENT_AMBULATORY_CARE_PROVIDER_SITE_OTHER): Payer: BC Managed Care – PPO | Admitting: Neurology

## 2017-05-05 DIAGNOSIS — M50123 Cervical disc disorder at C6-C7 level with radiculopathy: Secondary | ICD-10-CM | POA: Diagnosis not present

## 2017-05-05 HISTORY — DX: Cervical disc disorder at C6-C7 level with radiculopathy: M50.123

## 2017-05-05 NOTE — Progress Notes (Signed)
Reason for visit: Arthralgias, neck pain  Referring physician: Dr. Sherre Lain Derrick Duncan is a 51 y.o. male  History of present illness:  Derrick Duncan is a 51 year old left-handed white male with a history of a hospitalization occurred in March 2018 associated with a generalized malaise and polyarthritis issue that was eventually found to be secondary to a parvovirus infection. The patient has been followed through infectious disease, he is also followed through rheumatology in the [place], [place] area. The patient has had a significant inflammatory arthritis documented, joint fluid from one of the knees revealed 6400 white cells. The patient has been treated with prednisone and sulfasalazine, he is now off of the prednisone and he is feeling some better. The patient does report some neck discomfort, he has a history of bilateral carpal tunnel syndrome and prior surgery bilaterally. He does report some residual hand numbness but also has some numbness in the feet. During the period of time where the patient was having significant joint issues, the wife had noted that the patient was having a shuffling gait, he seemed to be somewhat stooped. The patient has improved in this regard. He denies any difficulty controlling the bowels or the bladder, he denies any balance issues. He is back to work. He does not feel completely recovered, he will have episodes of significant fatigue at times. 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 The patient also was found to have significant cervical spine disease with C6-7 disc bulge with free fragment. The patient was seen by neurosurgery, surgery was not recommended at that time. The patient was felt to have right arm weakness and a C7 distribution during hospitalization in March and April 2018, but this weakness has not persisted. The patient  is sent to this office for an evaluation.  Past Medical History:  Diagnosis Date  . Diabetes mellitus without complication (HCC)   . Hypercholesteremia   . Hypertension   . [facility] spotted fever   . Weakness of both legs 11/2016   right arm also    Past Surgical History:  Procedure Laterality Date  . CRANIOTOMY     subdural hematoma   . TONSILLECTOMY      Family History  Problem Relation Age of Onset  . Diabetes Father     Social history:  reports that he has never smoked. He has quit using smokeless tobacco. His smokeless tobacco use included Chew. He reports that he does not drink alcohol or use drugs.  Medications:  Prior to Admission medications   Medication Sig Start Date End Date Taking? Authorizing Provider  aspirin 81 MG tablet Take 81 mg by mouth daily.    [provider]  bismuth subsalicylate (PEPTO BISMOL) 262 MG chewable tablet Chew 524 mg by mouth as needed for indigestion.    [provider]  ibuprofen (ADVIL,MOTRIN) 200 MG tablet Take 200 mg by mouth every 6 (six) hours as needed for moderate pain.    [provider]  lisinopril (PRINIVIL,ZESTRIL) 40 MG tablet Take 40 mg by mouth daily.    [provider]  metFORMIN (GLUCOPHAGE) 1000 MG tablet Take 0.5 tablets (500 mg total) by mouth 2 (two) times daily. Patient taking differently: Take 1,000 mg by mouth 2 (two) times daily.  02/09/15   Ward, Layla Maw, DO  pantoprazole (PROTONIX) 40 MG tablet Take 1 tablet (40 mg total) by  mouth daily. 11/19/16   Dhungel, Theda Belfast, MD  predniSONE (DELTASONE) 20 MG tablet Take 3 tablets (60 mg total) by mouth daily with breakfast. 11/18/16   Dhungel, Nishant, MD  simvastatin (ZOCOR) 40 MG tablet Take 40 mg by mouth every morning.     [provider]  traMADol-acetaminophen (ULTRACET) 37.5-325 MG tablet Take 1 tablet by mouth every 8 (eight) hours as needed. 11/18/16   Dhungel, Theda Belfast, MD     No Known Allergies  ROS:  Out of a  complete 14 system review of symptoms, the patient complains only of the following symptoms, and all other reviewed systems are negative.  Fatigue Joint pain, knees and hips  Blood pressure 127/82, pulse 78, height 6' (1.829 m), weight 258 lb (117 kg).  Physical Exam  General: The patient is alert and cooperative at the time of the examination.  Eyes: Pupils are equal, round, and reactive to light. Discs are flat bilaterally.  Neck: The neck is supple, no carotid bruits are noted.  Respiratory: The respiratory examination is clear.  Cardiovascular: The cardiovascular examination reveals a regular rate and rhythm, no obvious murmurs or rubs are noted.  Skin: Extremities are without significant edema.  Neurologic Exam  Mental status: The patient is alert and oriented x 3 at the time of the examination. The patient has apparent normal recent and remote memory, with an apparently normal attention span and concentration ability.  Cranial nerves: Facial symmetry is present. There is good sensation of the face to pinprick and soft touch bilaterally. The strength of the facial muscles and the muscles to head turning and shoulder shrug are normal bilaterally. Speech is well enunciated, no aphasia or dysarthria is noted. Extraocular movements are full. Visual fields are full. The tongue is midline, and the patient has symmetric elevation of the soft palate. No obvious hearing deficits are noted.  Motor: The motor testing reveals 5 over 5 strength of all 4 extremities. Good symmetric motor tone is noted throughout.  Sensory: Sensory testing is intact to pinprick, soft touch, vibration sensation, and position sense on all 4 extremities. No evidence of extinction is noted.  Coordination: Cerebellar testing reveals good finger-nose-finger and heel-to-shin bilaterally.  Gait and station: Gait is normal. Tandem gait is normal. Romberg is negative. No drift is seen.  Reflexes: Deep tendon reflexes  are symmetric, but are depressed bilaterally. Toes are downgoing bilaterally.   MRI brain and cervical 11/15/16:  IMPRESSION: MRI brain demonstrates chronic changes of encephalomalacia in the LEFT temporal lobe status post prior surgery for subdural hematoma.  No acute intracranial findings. No findings suggestive of rickettsial disease  Motion degraded MRI cervical spine demonstrates a central and rightward disc extrusion at C6-7, caudally migrated free fragment. Correlate clinically for RIGHT C7 neural impingement contributing to RIGHT arm weakness.  Cord flattening without abnormal cord signal at multiple levels.  * MRI scan images were reviewed online. I agree with the written report.    Assessment/Plan:  1. Parvovirus infection, polyarthralgias  2. 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 The patient likely has asymptomatic cervical spine disease, the patient does have spinal cord impingement without evidence of damage to the spinal cord. This is an ancillary finding, his main symptoms have been related to the parvovirus. The patient should follow-up in the future with the surgeon once he is feeling better in regards to the fatigue and malaise. The patient has undergone extensive blood work evaluation that  includes a vitamin B12 level that was normal. I have nothing to offer at this point, the patient will follow-up through this office on an as-needed basis. The wife was mainly concerned that the patient may have multiple sclerosis or Parkinson's disease, I see no evidence of this on the MRI scan evaluations or on the clinical examination.  Marlan Palau MD 05/05/2017 8:34 AM  [facility] [address] [place], [place] [address]  Phone [phone] Fax [phone]

## 2017-09-23 IMAGING — MR MR HEAD W/O CM
13 of 19 series · 28 of 48 positions shown · non-contrast
Comparison: CT head 11/14/2016.  CT head 01/05/2013.

CLINICAL DATA: Joint pain and headaches. RIGHT arm weakness.
History of Ligita Moliene fever.

EXAM:
MRI HEAD WITHOUT CONTRAST
MRI CERVICAL SPINE WITHOUT CONTRAST
TECHNIQUE: Multiplanar, multiecho pulse sequences of the brain and surrounding
structures, and cervical spine, to include the craniocervical
junction and cervicothoracic junction, were obtained without
intravenous contrast.

[Series 3: FLAIR · sagittal · 5.0mm · 0.47mm/px · 1 of 26 slices shown (1 of 3)]
[im 1/26]
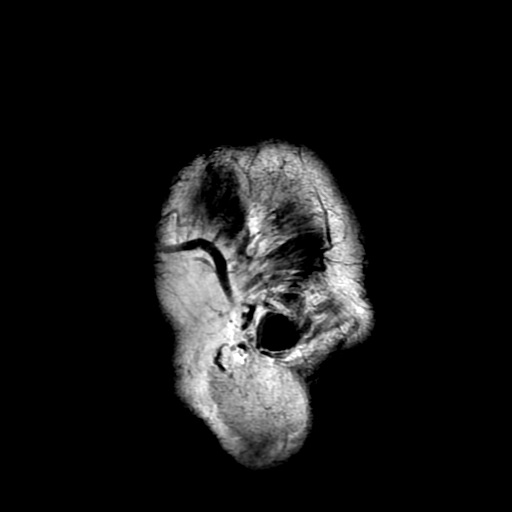

[Series 5: DWI · axial · 3.0mm · 0.94mm/px · z∈[-126,+20]mm · 5 of 100 slices shown (1 of 2)]
[im 1/100]
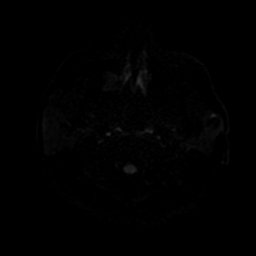
[im 25/100]
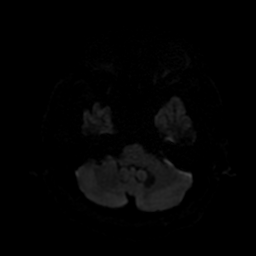
[im 50/100]
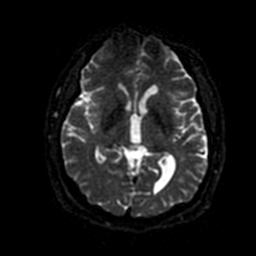
[im 75/100]
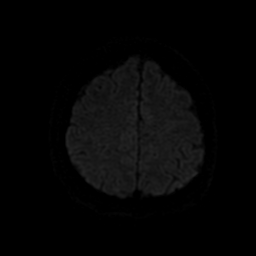
[im 100/100]
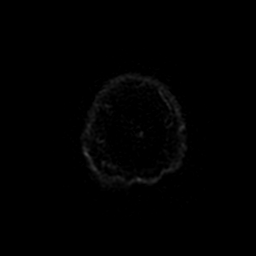

[Series 6: T2 · axial · 5.0mm · 0.47mm/px · 1 of 25 slices shown (1 of 3)]
[im 1/25]
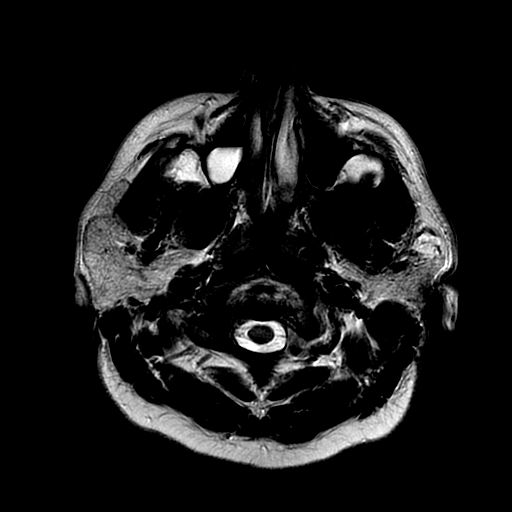

[Series 7: FLAIR · axial · 5.0mm · 0.47mm/px · 1 of 25 slices shown (2 of 3)]
[im 1/25]
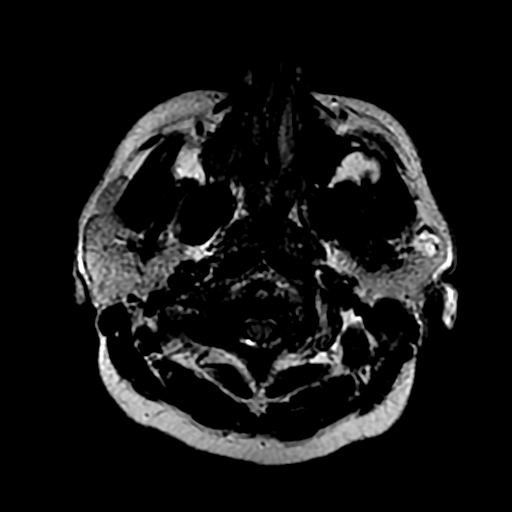

[Series 10: DWI · coronal · 4.0mm · 0.94mm/px · 4 of 72 slices shown (2 of 2)]
[im 1/72]
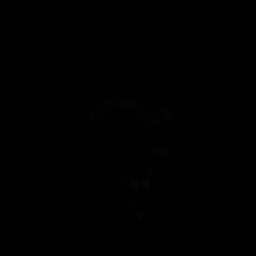
[im 24/72]
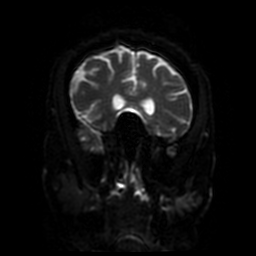
[im 48/72]
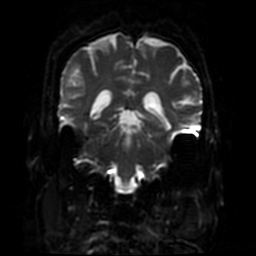
[im 72/72]
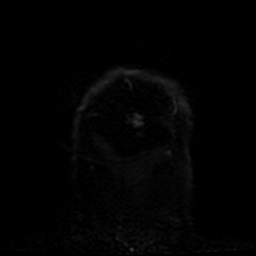

[Series 12: T2 post-contrast · coronal · 5.0mm · 0.39mm/px · 2 of 30 slices shown]
[im 1/30]
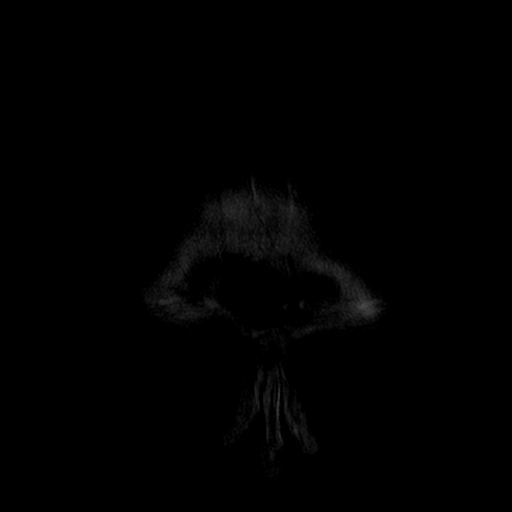
[im 30/30]
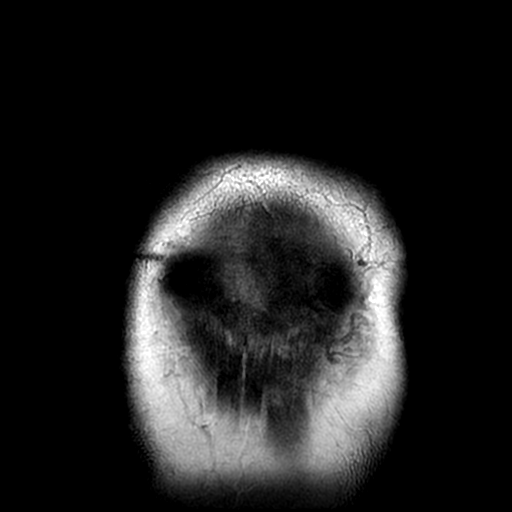

[Series 14: T2 · sagittal · 3.0mm · 0.43mm/px · 1 of 16 slices shown (2 of 3)]
[im 1/16]
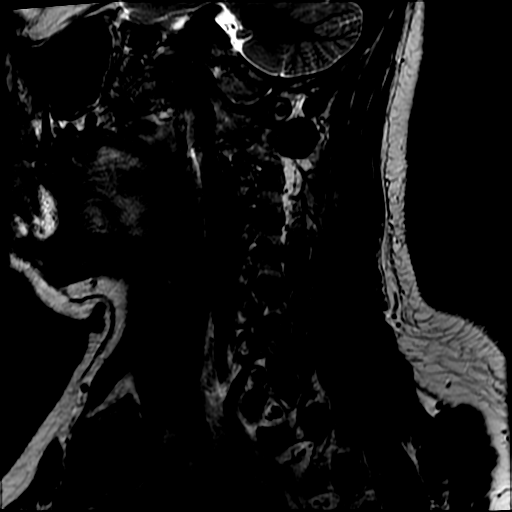

[Series 15: FLAIR · sagittal · 3.0mm · 0.43mm/px · 1 of 16 slices shown (3 of 3)]
[im 1/16]
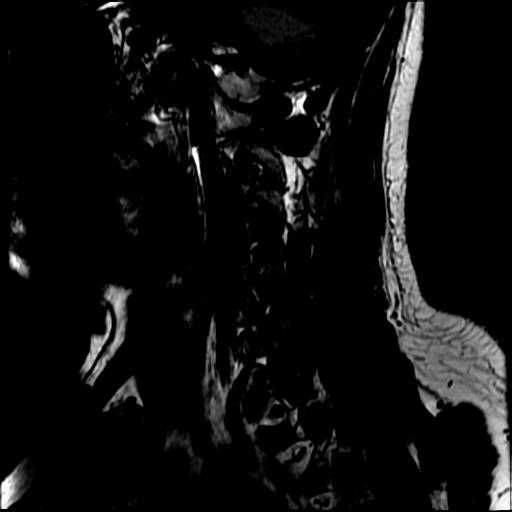

[Series 19: T2 · axial · 3.0mm · 0.35mm/px · z∈[-288,-138]mm · 3 of 46 slices shown (3 of 3)]
[im 1/46]
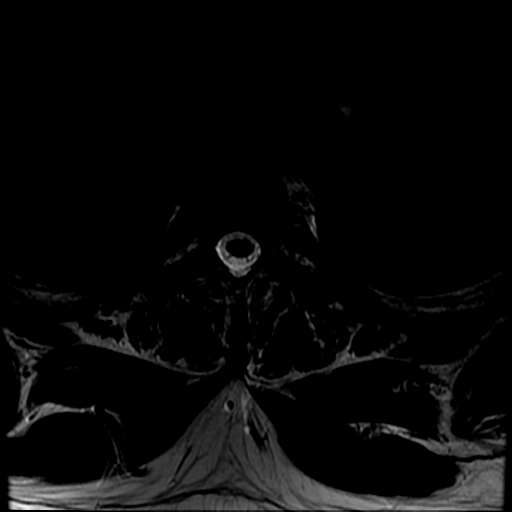
[im 23/46]
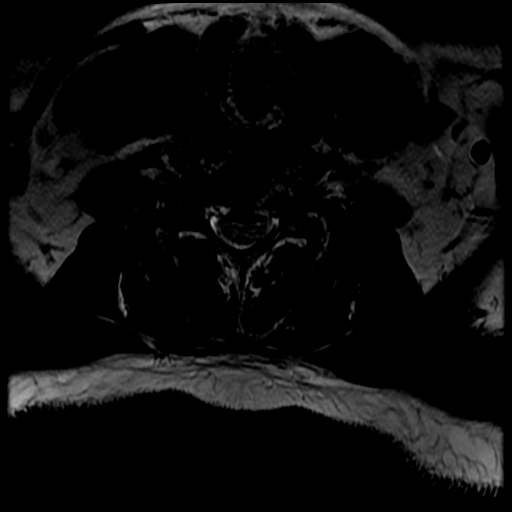
[im 46/46]
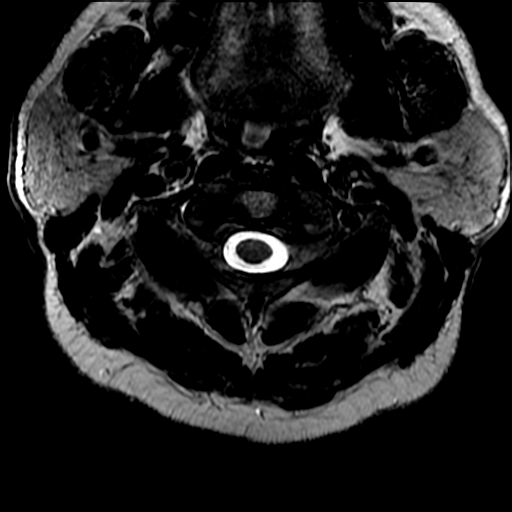

[Series 20: T1 · axial · 3.0mm · 0.35mm/px · z∈[-288,-138]mm · 3 of 46 slices shown]
[im 1/46]
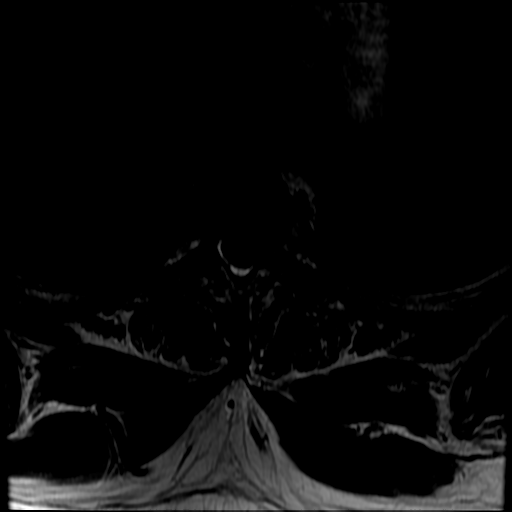
[im 23/46]
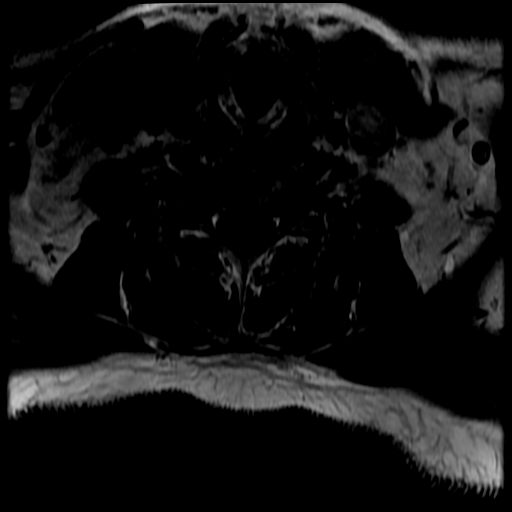
[im 46/46]
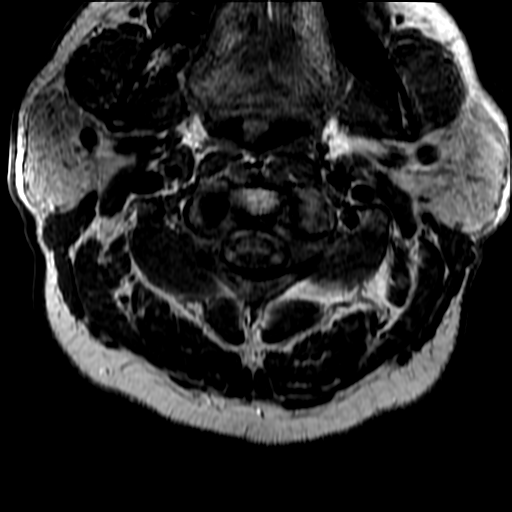

[Series 22: GRE · sagittal · 3.0mm · 0.43mm/px · 1 of 16 slices shown]
[im 1/16]
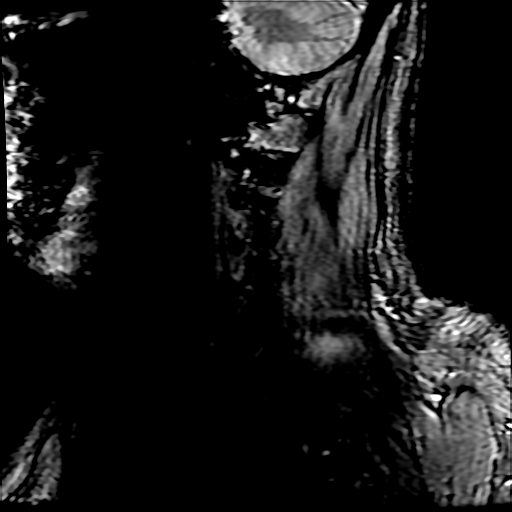

[Series 550: ADC · axial · 3.0mm · 0.94mm/px · z∈[-126,+20]mm · 3 of 50 slices shown (1 of 2)]
[im 1/50]
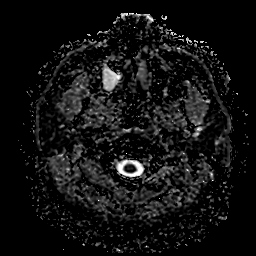
[im 25/50]
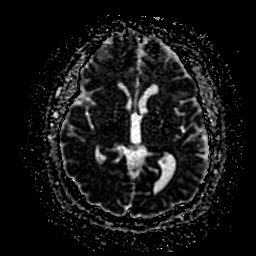
[im 50/50]
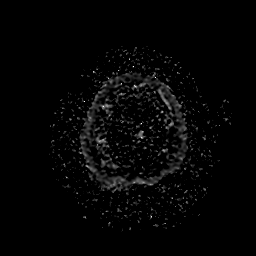

[Series 1050: ADC · coronal · 4.0mm · 0.94mm/px · 2 of 36 slices shown (2 of 2)]
[im 1/36]
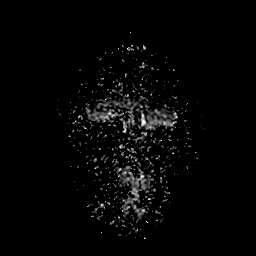
[im 36/36]
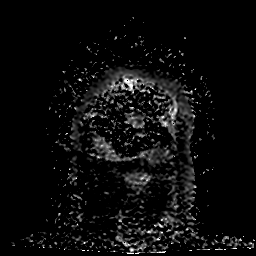

[28 of 48 positions shown; findings below may reference images not displayed]

FINDINGS: MRI HEAD FINDINGS

Brain: No evidence for acute infarction, hemorrhage, mass lesion,
hydrocephalus, or extra-axial fluid. Normal for age cerebral volume,
except for LEFT temporal encephalomalacia. Small craniectomy defect,
as well as chronic blood products in this region, sequelae of
treatment for what was reportedly a subdural hematoma. Minor white
matter disease remote from the area of LEFT temporal brain injury,
nonspecific.

Vascular: Normal flow voids.

Skull and upper cervical spine: Normal marrow signal.

Sinuses/Orbits: Negative.

Other: None.

MRI CERVICAL SPINE FINDINGS

Alignment: Reversal of the normal cervical lordotic curve. No
subluxation.

Vertebrae: No worrisome osseous lesion.

Cord: Cord compression without abnormal cord signal at C3-4, C5-6,
and C6-7.

Posterior Fossa, vertebral arteries, paraspinal tissues:
Unremarkable.

Disc levels:

C2-3:  Normal.

C3-4: Central and leftward protrusion/disc osteophyte complex with
LEFT-sided uncinate spurring. LEFT-sided cord flattening is
prominent. Canal diameter 6-7 mm. LEFT C4 foraminal narrowing.

C4-5: Annular bulge. RIGHT-sided uncinate spurring. RIGHT C5
foraminal narrowing.

C5-6: Central and leftward protrusion/ disc osteophyte complex. Mild
facet arthropathy. LEFT-sided cord flattening. Canal diameter 6-7
mm. LEFT C6 foraminal narrowing.

C6-7: Central and rightward disc extrusion. Caudally migrated free
fragment. Central and RIGHT-sided cord flattening. Canal diameter
6-7 mm. RIGHT greater than LEFT C7 foraminal narrowing.

C7-T1:  Unremarkable.
IMPRESSION: MRI brain demonstrates chronic changes of encephalomalacia in the
LEFT temporal lobe status post prior surgery for subdural hematoma.

No acute intracranial findings. No findings suggestive of
rickettsial disease

Motion degraded MRI cervical spine demonstrates a central and
rightward disc extrusion at C6-7, caudally migrated free fragment.
Correlate clinically for RIGHT C7 neural impingement contributing to
RIGHT arm weakness.

Cord flattening without abnormal cord signal at multiple levels.

## 2019-03-19 ENCOUNTER — Encounter: Payer: Self-pay | Admitting: *Deleted

## 2019-05-01 ENCOUNTER — Other Ambulatory Visit: Payer: Self-pay

## 2019-05-01 ENCOUNTER — Ambulatory Visit (INDEPENDENT_AMBULATORY_CARE_PROVIDER_SITE_OTHER): Payer: Self-pay | Admitting: *Deleted

## 2019-05-01 DIAGNOSIS — Z1211 Encounter for screening for malignant neoplasm of colon: Secondary | ICD-10-CM

## 2019-05-01 MED ORDER — PEG 3350-KCL-NA BICARB-NACL 420 G PO SOLR: 4000.0000 mL | Freq: Once | ORAL | 0 refills | Status: AC

## 2019-05-01 NOTE — Progress Notes (Signed)
Gastroenterology Pre-Procedure Review  Request Date: 05/01/2019 Requesting Physician: Rowan Blase, PA @ [facility], Last TCS 10 or more years ago done in [place], pt unsure of physician, no polyps per pt  PATIENT REVIEW QUESTIONS: The patient responded to the following health history questions as indicated:    1. Diabetes Melitis: Yes 2. Joint replacements in the past 12 months: No 3. Major health problems in the past 3 months: No 4. Has an artificial valve or MVP: No 5. Has a defibrillator: No 6. Has been advised in past to take antibiotics in advance of a procedure like teeth cleaning: No 7. Family history of colon cancer: No  8. Alcohol Use: Yes, 2 beers a week 9. History of sleep apnea: No  10. History of coronary artery or other vascular stents placed within the last 12 months: No 11. History of any prior anesthesia complications: No    MEDICATIONS & ALLERGIES:    Patient reports the following regarding taking any blood thinners:   Plavix? No Aspirin? Yes Coumadin? No Brilinta? No Xarelto? No Eliquis? No Pradaxa? No Savaysa? No Effient? No  Patient confirms/reports the following medications:  Current Outpatient Medications  Medication Sig Dispense Refill  . aspirin 81 MG tablet Take 81 mg by mouth daily.    Marland Kitchen bismuth subsalicylate (PEPTO BISMOL) 262 MG chewable tablet Chew 524 mg by mouth as needed for indigestion.    Marland Kitchen lisinopril (PRINIVIL,ZESTRIL) 40 MG tablet Take 40 mg by mouth daily.    . metFORMIN (GLUCOPHAGE) 1000 MG tablet Take 0.5 tablets (500 mg total) by mouth 2 (two) times daily. (Patient taking differently: Take 1,000 mg by mouth 2 (two) times daily. ) 60 tablet 0  . Multiple Vitamin (MULTIVITAMIN) tablet Take 1 tablet by mouth as needed.    . simvastatin (ZOCOR) 40 MG tablet Take 40 mg by mouth every morning.     . valACYclovir (VALTREX) 500 MG tablet Take 500 mg by mouth daily.     No current facility-administered medications for this visit.     Patient  confirms/reports the following allergies:  No Known Allergies  No orders of the defined types were placed in this encounter.   AUTHORIZATION INFORMATION Primary Insurance: [place],  [place] #: [id number],  Group #: [id number] Pre-Cert / Josem Kaufmann required: No, not required  SCHEDULE INFORMATION: Procedure has been scheduled as follows:  Date: 07/04/2019, Time: 2:00 Location: [facility] with Dr. Gala Romney  This Gastroenterology Pre-Precedure Review Form is being routed to the following provider(s): Neil Crouch, PA-C

## 2019-05-01 NOTE — Patient Instructions (Signed)
Derrick Duncan   03-09-1966 MRN: [id number]    Procedure Date: 07/04/2019 Time to register: 1:00 pm Place to register: [facility] Procedure Time: 2:00 pm Scheduled provider: Dr. Gala Romney  PREPARATION FOR COLONOSCOPY WITH TRI-LYTE SPLIT PREP  Please notify us immediately if you are diabetic, take iron supplements, or if you are on Coumadin or any other blood thinners.   Please hold the following medications: See letter  You will need to purchase 1 fleet enema and 1 box of Bisacodyl 45m tablets.   2 DAYS BEFORE PROCEDURE:  DATE: 07/02/2019   DAY: Monday Begin clear liquid diet AFTER your lunch meal. NO SOLID FOODS after this point.  1 DAY BEFORE PROCEDURE:  DATE: 07/03/2019  DAY: Tuesday Continue clear liquids the entire day - NO SOLID FOOD.   Diabetic medications adjustments for today: See letter  At 2:00 pm:  Take 2 Bisacodyl tablets.   At 4:00pm:  Start drinking your solution. Make sure you mix well per instructions on the bottle. Try to drink 1 (one) 8 ounce glass every 10-15 minutes until you have consumed HALF the jug. You should complete by 6:00pm.You must keep the left over solution refrigerated until completed next day.  Continue clear liquids. You must drink plenty of clear liquids to prevent dehyration and kidney failure.     DAY OF PROCEDURE:   DATE: 07/04/2019   DAY: Wednesday If you take medications for your heart, blood pressure or breathing, you may take these medications.  Diabetic medications adjustments for today: See letter  Five hours before your procedure time @ 9:00 am:  Finish remaining amout of bowel prep, drinking 1 (one) 8 ounce glass every 10-15 minutes until complete. You have two hours to consume remaining prep.   Three hours before your procedure time @ 11:00 am:  Nothing by mouth.   At least one hour before going to the hospital:  Give yourself one Fleet enema. You may take your morning medications with sip of water unless we have  instructed otherwise.      Please see below for Dietary Information.  CLEAR LIQUIDS INCLUDE:  Water Jello (NOT red in color)   Ice Popsicles (NOT red in color)   Tea (sugar ok, no milk/cream) Powdered fruit flavored drinks  Coffee (sugar ok, no milk/cream) Gatorade/ Lemonade/ Kool-Aid  (NOT red in color)   Juice: apple, white grape, white cranberry Soft drinks  Clear bullion, consomme, broth (fat free beef/chicken/vegetable)  Carbonated beverages (any kind)  Strained chicken noodle soup Hard Candy   Remember: Clear liquids are liquids that will allow you to see your fingers on the other side of a clear glass. Be sure liquids are NOT red in color, and not cloudy, but CLEAR.  DO NOT EAT OR DRINK ANY OF THE FOLLOWING:  Dairy products of any kind   Cranberry juice Tomato juice / V8 juice   Grapefruit juice Orange juice     Red grape juice  Do not eat any solid foods, including such foods as: cereal, oatmeal, yogurt, fruits, vegetables, creamed soups, eggs, bread, crackers, pureed foods in a blender, etc.   HELPFUL HINTS FOR DRINKING PREP SOLUTION:   Make sure prep is extremely cold. Mix and refrigerate the the morning of the prep. You may also put in the freezer.   You may try mixing some Crystal Light or Country Time Lemonade if you prefer. Mix in small amounts; add more if necessary.  Try drinking through a straw  Rinse mouth  with water or a mouthwash between glasses, to remove after-taste.  Try sipping on a cold beverage /ice/ popsicles between glasses of prep.  Place a piece of sugar-free hard candy in mouth between glasses.  If you become nauseated, try consuming smaller amounts, or stretch out the time between glasses. Stop for 30-60 minutes, then slowly start back drinking.        OTHER INSTRUCTIONS  You will need a responsible adult at least 52 years of age to accompany you and drive you home. This person must remain in the waiting room during your procedure. The  hospital will cancel your procedure if you do not have a responsible adult with you.   1. Wear loose fitting clothing that is easily removed. 2. Leave jewelry and other valuables at home.  3. Remove all body piercing jewelry and leave at home. 4. Total time from sign-in until discharge is approximately 2-3 hours. 5. You should go home directly after your procedure and rest. You can resume normal activities the day after your procedure. 6. The day of your procedure you should not:  Drive  Make legal decisions  Operate machinery  Drink alcohol  Return to work   You may call the office (Dept: [phone]) before 5:00pm, or page the doctor on call ([phone]) after 5:00pm, for further instructions, if necessary.   Insurance Information YOU WILL NEED TO CHECK WITH YOUR INSURANCE COMPANY FOR THE BENEFITS OF COVERAGE YOU HAVE FOR THIS PROCEDURE.  UNFORTUNATELY, NOT ALL INSURANCE COMPANIES HAVE BENEFITS TO COVER ALL OR PART OF THESE TYPES OF PROCEDURES.  IT IS YOUR RESPONSIBILITY TO CHECK YOUR BENEFITS, HOWEVER, WE WILL BE GLAD TO ASSIST YOU WITH ANY CODES YOUR INSURANCE COMPANY MAY NEED.    PLEASE NOTE THAT MOST INSURANCE COMPANIES WILL NOT COVER A SCREENING COLONOSCOPY FOR PEOPLE UNDER THE AGE OF 50  IF YOU HAVE BCBS INSURANCE, YOU MAY HAVE BENEFITS FOR A SCREENING COLONOSCOPY BUT IF POLYPS ARE FOUND THE DIAGNOSIS WILL CHANGE AND THEN YOU MAY HAVE A DEDUCTIBLE THAT WILL NEED TO BE MET. SO PLEASE MAKE SURE YOU CHECK YOUR BENEFITS FOR A SCREENING COLONOSCOPY AS WELL AS A DIAGNOSTIC COLONOSCOPY.  

## 2019-05-01 NOTE — Progress Notes (Signed)
Ok to schedule.  No pepto for one week before tcs. Day before tcs: 1/2 dose metformin (ie 500mg  bid) Am of tcs: hold metformin

## 2019-05-02 ENCOUNTER — Encounter: Payer: Self-pay | Admitting: *Deleted

## 2019-05-02 NOTE — Addendum Note (Signed)
Addended by: Metro Kung on: 05/02/2019 11:41 AM   Modules accepted: Orders, SmartSet

## 2019-05-02 NOTE — Progress Notes (Signed)
Spoke with pt and informed him of medication adjustment recommendations. Pt informed me that he is only taking 1000 mg total in a day.  Please advise.  Pt is also aware not to take any Pepto one week before procedure.  Pt is aware that I will be sending a letter to him with these adjustments.

## 2019-05-02 NOTE — Progress Notes (Signed)
Then take metformin 250mg  BID day before Am of tcs hold metformin

## 2019-05-02 NOTE — Progress Notes (Signed)
Mailed letter to pt with medication adjustments.

## 2019-05-03 ENCOUNTER — Encounter: Payer: Self-pay | Admitting: *Deleted

## 2019-05-03 NOTE — Progress Notes (Signed)
Noted and corrected in letter.  Mailing out letter to pt with medication adjustments.

## 2019-07-02 ENCOUNTER — Other Ambulatory Visit: Payer: Self-pay

## 2019-07-02 ENCOUNTER — Other Ambulatory Visit (HOSPITAL_COMMUNITY)
Admission: RE | Admit: 2019-07-02 | Discharge: 2019-07-02 | Disposition: A | Discharge status: Home / Self Care | Payer: BC Managed Care – PPO | Source: Ambulatory Visit | Attending: Internal Medicine | Admitting: Internal Medicine

## 2019-07-02 DIAGNOSIS — Z01812 Encounter for preprocedural laboratory examination: Secondary | ICD-10-CM | POA: Insufficient documentation

## 2019-07-02 DIAGNOSIS — Z20828 Contact with and (suspected) exposure to other viral communicable diseases: Secondary | ICD-10-CM | POA: Diagnosis not present

## 2019-07-02 LAB — SARS CORONAVIRUS 2 (TAT 6-24 HRS): SARS Coronavirus 2: NEGATIVE

## 2019-07-03 ENCOUNTER — Telehealth: Payer: Self-pay | Admitting: *Deleted

## 2019-07-03 NOTE — Telephone Encounter (Signed)
Endo called wanting to see if patient could move procedure time up to 12:00pm.   Called pt and he was agreeable. Aware to arrive at 11:00am. He will drink 2nd half of prep tomorrow 7:00am, npo 9:00am. He voiced understanding  Endo aware of change

## 2019-07-04 ENCOUNTER — Ambulatory Visit (HOSPITAL_COMMUNITY)
Admission: RE | Admit: 2019-07-04 | Discharge: 2019-07-04 | Disposition: A | Discharge status: Home / Self Care | Payer: BC Managed Care – PPO | Attending: Internal Medicine | Admitting: Internal Medicine

## 2019-07-04 ENCOUNTER — Encounter (HOSPITAL_COMMUNITY): Payer: Self-pay | Admitting: *Deleted

## 2019-07-04 ENCOUNTER — Other Ambulatory Visit: Payer: Self-pay

## 2019-07-04 ENCOUNTER — Encounter (HOSPITAL_COMMUNITY)
Admission: RE | Disposition: A | Discharge status: Home / Self Care | Payer: Self-pay | Source: Home / Self Care | Attending: Internal Medicine

## 2019-07-04 DIAGNOSIS — E119 Type 2 diabetes mellitus without complications: Secondary | ICD-10-CM | POA: Diagnosis not present

## 2019-07-04 DIAGNOSIS — Z7982 Long term (current) use of aspirin: Secondary | ICD-10-CM | POA: Diagnosis not present

## 2019-07-04 DIAGNOSIS — I10 Essential (primary) hypertension: Secondary | ICD-10-CM | POA: Diagnosis not present

## 2019-07-04 DIAGNOSIS — Z79899 Other long term (current) drug therapy: Secondary | ICD-10-CM | POA: Diagnosis not present

## 2019-07-04 DIAGNOSIS — Z1211 Encounter for screening for malignant neoplasm of colon: Secondary | ICD-10-CM | POA: Insufficient documentation

## 2019-07-04 DIAGNOSIS — Z7984 Long term (current) use of oral hypoglycemic drugs: Secondary | ICD-10-CM | POA: Diagnosis not present

## 2019-07-04 DIAGNOSIS — E7800 Pure hypercholesterolemia, unspecified: Secondary | ICD-10-CM | POA: Insufficient documentation

## 2019-07-04 HISTORY — PX: COLONOSCOPY: SHX5424

## 2019-07-04 LAB — GLUCOSE, CAPILLARY: Glucose-Capillary: 107 mg/dL — ABNORMAL HIGH (ref 70–99)

## 2019-07-04 SURGERY — COLONOSCOPY
Anesthesia: Moderate Sedation

## 2019-07-04 MED ORDER — MEPERIDINE HCL 100 MG/ML IJ SOLN
INTRAMUSCULAR | Status: DC | PRN
  Administered 2019-07-04: 15 mg via INTRAVENOUS
  Administered 2019-07-04: 25 mg via INTRAVENOUS

## 2019-07-04 MED ORDER — STERILE WATER FOR IRRIGATION IR SOLN
Status: DC | PRN
  Administered 2019-07-04: 1.5 mL

## 2019-07-04 MED ORDER — MIDAZOLAM HCL 5 MG/5ML IJ SOLN
INTRAMUSCULAR | Status: DC | PRN
  Administered 2019-07-04 (×2): 1 mg via INTRAVENOUS
  Administered 2019-07-04: 2 mg via INTRAVENOUS
  Administered 2019-07-04: 1 mg via INTRAVENOUS
  Administered 2019-07-04: 2 mg via INTRAVENOUS

## 2019-07-04 MED ORDER — ONDANSETRON HCL 4 MG/2ML IJ SOLN
INTRAMUSCULAR | Status: AC
  Filled 2019-07-04: qty 2

## 2019-07-04 MED ORDER — ONDANSETRON HCL 4 MG/2ML IJ SOLN
INTRAMUSCULAR | Status: DC | PRN
  Administered 2019-07-04: 4 mg via INTRAVENOUS

## 2019-07-04 MED ORDER — MIDAZOLAM HCL 5 MG/5ML IJ SOLN
INTRAMUSCULAR | Status: AC
  Filled 2019-07-04: qty 10

## 2019-07-04 MED ORDER — SODIUM CHLORIDE 0.9 % IV SOLN
INTRAVENOUS | Status: DC
  Administered 2019-07-04: 12:00:00 via INTRAVENOUS

## 2019-07-04 MED ORDER — MEPERIDINE HCL 50 MG/ML IJ SOLN
INTRAMUSCULAR | Status: AC
  Filled 2019-07-04: qty 1

## 2019-07-04 NOTE — Op Note (Signed)
[facility] Patient Name: Derrick Duncan Procedure Date: 07/04/2019 10:55 AM MRN: [id number] Date of Birth: Jan 01, 1966 Attending MD: Norvel Richards , MD CSN: [id number] Age: 53 Admit Type: Outpatient Procedure:                Colonoscopy Indications:              Screening for colorectal malignant neoplasm Providers:                Norvel Richards, MD, Gerome Sam, RN,                            Nelma Rothman, Technician Referring MD:              Medicines:                Midazolam 7 mg IV, Meperidine 40 mg IV, Ondansetron                            4 mg IV Complications:            No immediate complications. Estimated Blood Loss:     Estimated blood loss: none. Procedure:                Pre-Anesthesia Assessment:                           - Prior to the procedure, a History and Physical                            was performed, and patient medications and                            allergies were reviewed. The patient's tolerance of                            previous anesthesia was also reviewed. The risks                            and benefits of the procedure and the sedation                            options and risks were discussed with the patient.                            All questions were answered, and informed consent                            was obtained. Prior Anticoagulants: The patient has                            taken no previous anticoagulant or antiplatelet                            agents. ASA Grade Assessment: II - A patient with  mild systemic disease. After reviewing the risks                            and benefits, the patient was deemed in                            satisfactory condition to undergo the procedure.                           After obtaining informed consent, the colonoscope                            was passed under direct vision. Throughout the                            procedure, the  patient's blood pressure, pulse, and                            oxygen saturations were monitored continuously. The                            [id number]-[id number] ([id number]) scope was introduced through                            the anus and advanced to the the cecum, identified                            by appendiceal orifice and ileocecal valve. The                            ileocecal valve, appendiceal orifice, and rectum                            were photographed. The entire colon was well                            visualized. The quality of the bowel preparation                            was adequate. Scope In: 11:57:24 AM Scope Out: 12:14:53 PM Scope Withdrawal Time: 0 hours 6 minutes 47 seconds  Total Procedure Duration: 0 hours 17 minutes 29 seconds  Findings:      The perianal and digital rectal examinations were normal.      The colon (entire examined portion) appeared normal.      The retroflexed view of the distal rectum and anal verge was normal and       showed no anal or rectal abnormalities. Impression:               - The entire examined colon is normal.                           - The distal rectum and anal verge are normal on  retroflexion view.                           - No specimens collected. Moderate Sedation:      Moderate (conscious) sedation was administered by the endoscopy nurse       and supervised by the endoscopist. The following parameters were       monitored: oxygen saturation, heart rate, blood pressure, respiratory       rate, EKG, adequacy of pulmonary ventilation, and response to care.       Total physician intraservice time was 24 minutes. Recommendation:           - Patient has a contact number available for                            emergencies. The signs and symptoms of potential                            delayed complications were discussed with the                            patient. Return to normal activities  tomorrow.                            Written discharge instructions were provided to the                            patient.                           - Advance diet as tolerated.                           - Repeat colonoscopy in 10 years for screening                            purposes.                           - Return to GI office PRN. Procedure Code(s):        --- Professional ---                           [phone], Colonoscopy, flexible; diagnostic, including                            collection of specimen(s) by brushing or washing,                            when performed (separate procedure)                           99153, Moderate sedation; each additional 15                            minutes intraservice time  G0500, Moderate sedation services provided by the                            same physician or other qualified health care                            professional performing a gastrointestinal                            endoscopic service that sedation supports,                            requiring the presence of an independent trained                            observer to assist in the monitoring of the                            patient's level of consciousness and physiological                            status; initial 15 minutes of intra-service time;                            patient age 85 years or older (additional time may                            be reported with 16109, as appropriate) Diagnosis Code(s):        --- Professional ---                           Z12.11, Encounter for screening for malignant                            neoplasm of colon CPT copyright 2019 [employer]. All rights reserved. The codes documented in this report are preliminary and upon coder review may  be revised to meet current compliance requirements. Gerrit Friends. Dhillon [place]un[place], MD Gennette Pac, MD 07/04/2019 12:24:01 PM This report has  been signed electronically. Number of Addenda: 0

## 2019-07-04 NOTE — Discharge Instructions (Signed)
°  Colonoscopy Discharge Instructions  Read the instructions outlined below and refer to this sheet in the next few weeks. These discharge instructions provide you with general information on caring for yourself after you leave the hospital. Your doctor may also give you specific instructions. While your treatment has been planned according to the most current medical practices available, unavoidable complications occasionally occur. If you have any problems or questions after discharge, call Dr. Gala Romney at [phone]. ACTIVITY  You may resume your regular activity, but move at a slower pace for the next 24 hours.   Take frequent rest periods for the next 24 hours.   Walking will help get rid of the air and reduce the bloated feeling in your belly (abdomen).   No driving for 24 hours (because of the medicine (anesthesia) used during the test).    Do not sign any important legal documents or operate any machinery for 24 hours (because of the anesthesia used during the test).  NUTRITION  Drink plenty of fluids.   You may resume your normal diet as instructed by your doctor.   Begin with a light meal and progress to your normal diet. Heavy or fried foods are harder to digest and may make you feel sick to your stomach (nauseated).   Avoid alcoholic beverages for 24 hours or as instructed.  MEDICATIONS  You may resume your normal medications unless your doctor tells you otherwise.  WHAT YOU CAN EXPECT TODAY  Some feelings of bloating in the abdomen.   Passage of more gas than usual.   Spotting of blood in your stool or on the toilet paper.  IF YOU HAD POLYPS REMOVED DURING THE COLONOSCOPY:  No aspirin products for 7 days or as instructed.   No alcohol for 7 days or as instructed.   Eat a soft diet for the next 24 hours.  FINDING OUT THE RESULTS OF YOUR TEST Not all test results are available during your visit. If your test results are not back during the visit, make an appointment  with your caregiver to find out the results. Do not assume everything is normal if you have not heard from your caregiver or the medical facility. It is important for you to follow up on all of your test results.  SEEK IMMEDIATE MEDICAL ATTENTION IF:  You have more than a spotting of blood in your stool.   Your belly is swollen (abdominal distention).   You are nauseated or vomiting.   You have a temperature over 101.   You have abdominal pain or discomfort that is severe or gets worse throughout the day.    Your colonoscopy was normal today  I recommend a repeat colonoscopy for screening purposes in 10 years  At your request, I called Derrick Duncan at [phone] and left a message with results

## 2019-07-04 NOTE — H&P (Signed)
@LOGO @   Primary Care Physician:  Ginger Organ Primary Gastroenterologist:  Dr. Gala Romney  Pre-Procedure History & Physical: HPI:  Derrick Duncan is a 53 y.o. male is here for a screening colonoscopy.  Negative colonoscopy 10 years ago done for rectal bleeding.  Past Medical History:  Diagnosis Date  . 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 Diabetes mellitus without complication ([place])   . Hypercholesteremia   . Hypertension   . [facility] spotted fever   . Weakness of both legs 11/2016   right arm also    Past Surgical History:  Procedure Laterality Date  . CRANIOTOMY     subdural hematoma   . TONSILLECTOMY      Prior to Admission medications   Medication Sig Start Date End Date Taking? Authorizing Provider  aspirin 81 MG tablet Take 81 mg by mouth daily.   Yes [provider]  lisinopril (PRINIVIL,ZESTRIL) 40 MG tablet Take 40 mg by mouth daily.   Yes [provider]  magnesium oxide (MAG-OX) 400 MG tablet Take 400 mg by mouth daily.   Yes [provider]  metFORMIN (GLUCOPHAGE) 1000 MG tablet Take 0.5 tablets (500 mg total) by mouth 2 (two) times daily. 02/09/15  Yes Ward, Delice Bison, DO  simvastatin (ZOCOR) 40 MG tablet Take 40 mg by mouth every morning.    Yes [provider]  bismuth subsalicylate (PEPTO BISMOL) 262 MG chewable tablet Chew 524 mg by mouth as needed for indigestion.    [provider]  valACYclovir (VALTREX) 500 MG tablet Take 500 mg by mouth as needed (Cold sore).     [provider]    Allergies as of 05/02/2019  . (No Known Allergies)    Family History  Problem Relation Age of Onset  . Diabetes Father     Social History   Socioeconomic History  . Marital status: Married    Spouse name: Not on file  . Number of children: 4  . Years of education: Mastersx3  . Highest education level: Not on file  Occupational History  . Not on file  Social  Needs  . Financial resource strain: Not on file  . Food insecurity    Worry: Not on file    Inability: Not on file  . Transportation needs    Medical: Not on file    Non-medical: Not on file  Tobacco Use  . Smoking status: Never Smoker  . Smokeless tobacco: Former Systems developer    Types: Chew  Substance and Sexual Activity  . Alcohol use: Yes    Alcohol/week: 1.0 - 2.0 standard drinks    Types: 1 - 2 Standard drinks or equivalent per week    Comment: 2 beers a week  . Drug use: No  . Sexual activity: Not on file  Lifestyle  . Physical activity    Days per week: Not on file    Minutes per session: Not on file  . Stress: Not on file  Relationships  . Social Herbalist on phone: Not on file    Gets together: Not on file    Attends religious service: Not on file    Active member of club or organization: Not on file    Attends meetings of clubs or organizations: Not on file    Relationship status: Not on file  . Intimate partner violence    Fear of current or ex partner: Not on file  Emotionally abused: Not on file    Physically abused: Not on file    Forced sexual activity: Not on file  Other Topics Concern  . Not on file  Social History Narrative   Lives with wife   Left handed    Caffeine use: Drinks coffee daily   Diet soft drinks    Review of Systems: See HPI, otherwise negative ROS  Physical Exam: BP (!) 142/103   Pulse 63   Temp 97.8 F (36.6 C) (Oral)   Resp 18   Ht 6' (1.829 m)   Wt 104.3 kg   SpO2 98%   BMI 31.19 kg/m  General:   Alert,  Well-developed, well-nourished, pleasant and cooperative in NAD Neck:  Supple; no masses or thyromegaly. Lungs:  Clear throughout to auscultation.   No wheezes, crackles, or rhonchi. No acute distress. Heart:  Regular rate and rhythm; no murmurs, clicks, rubs,  or gallops. Abdomen:  Soft, nontender and nondistended. No masses, hepatosplenomegaly or hernias noted. Normal bowel sounds, without guarding, and  without rebound.    Impression/Plan: Derrick Duncan is now here to undergo a screening colonoscopy.  First ever screening examination.  Risks, benefits, limitations, imponderables and alternatives regarding colonoscopy have been reviewed with the patient. Questions have been answered. All parties agreeable.     Notice:  This dictation was prepared with Dragon dictation along with smaller phrase technology. Any transcriptional errors that result from this process are unintentional and may not be corrected upon review.

## 2019-07-06 ENCOUNTER — Encounter (HOSPITAL_COMMUNITY): Payer: Self-pay | Admitting: Internal Medicine

## 2019-10-13 ENCOUNTER — Ambulatory Visit: Payer: BC Managed Care – PPO | Attending: Internal Medicine

## 2019-10-13 DIAGNOSIS — Z23 Encounter for immunization: Secondary | ICD-10-CM

## 2019-10-13 NOTE — Progress Notes (Signed)
   Covid-19 Vaccination Clinic  Name:  Derrick Duncan    MRN: [id number] DOB: March 26, 1966  10/13/2019  Mr. Derrick Duncan was observed post Covid-19 immunization for 15 minutes without incidence. He was provided with Vaccine Information Sheet and instruction to access the V-Safe system.   Mr. Derrick Duncan was instructed to call 911 with any severe reactions post vaccine: Marland Kitchen Difficulty breathing  . Swelling of your face and throat  . A fast heartbeat  . A bad rash all over your body  . Dizziness and weakness    Immunizations Administered    Name Date Dose VIS Date Route   Pfizer COVID-19 Vaccine 10/13/2019  6:51 PM 0.3 mL 07/27/2019 Intramuscular   Manufacturer: [employer], [employer]   Lot: [id number]   NDC: [address]-5

## 2019-11-05 ENCOUNTER — Ambulatory Visit: Payer: BC Managed Care – PPO | Attending: Internal Medicine

## 2019-11-05 DIAGNOSIS — Z23 Encounter for immunization: Secondary | ICD-10-CM

## 2019-11-05 NOTE — Progress Notes (Signed)
   Covid-19 Vaccination Clinic  Name:  Derrick Duncan    MRN: [id number] DOB: 1965/10/09  11/05/2019  Mr. [name] was observed post Covid-19 immunization for 15 minutes without incident. He was provided with Vaccine Information Sheet and instruction to access the V-Safe system.   Mr. [name] was instructed to call 911 with any severe reactions post vaccine: Marland Kitchen Difficulty breathing  . Swelling of face and throat  . A fast heartbeat  . A bad rash all over body  . Dizziness and weakness   Immunizations Administered    Name Date Dose VIS Date Route   Pfizer COVID-19 Vaccine 11/05/2019  8:19 AM 0.3 mL 07/27/2019 Intramuscular   Manufacturer: [employer], [employer]   Lot: [id number]   NDC: [address]-9

## 2020-09-16 ENCOUNTER — Encounter: Payer: Self-pay | Admitting: Cardiology

## 2020-09-16 ENCOUNTER — Ambulatory Visit: Payer: BC Managed Care – PPO | Admitting: Cardiology

## 2020-09-16 ENCOUNTER — Other Ambulatory Visit (HOSPITAL_COMMUNITY)
Admission: RE | Admit: 2020-09-16 | Discharge: 2020-09-16 | Disposition: A | Discharge status: Home / Self Care | Payer: BC Managed Care – PPO | Source: Ambulatory Visit | Attending: Cardiology | Admitting: Cardiology

## 2020-09-16 ENCOUNTER — Other Ambulatory Visit: Payer: Self-pay

## 2020-09-16 VITALS — BP 132/94 | HR 88 | Temp 97.9°F | Resp 16 | Ht 72.0 in | Wt 228.8 lb

## 2020-09-16 DIAGNOSIS — Z20822 Contact with and (suspected) exposure to covid-19: Secondary | ICD-10-CM | POA: Insufficient documentation

## 2020-09-16 DIAGNOSIS — I200 Unstable angina: Secondary | ICD-10-CM

## 2020-09-16 DIAGNOSIS — Z8249 Family history of ischemic heart disease and other diseases of the circulatory system: Secondary | ICD-10-CM | POA: Diagnosis not present

## 2020-09-16 DIAGNOSIS — E7800 Pure hypercholesterolemia, unspecified: Secondary | ICD-10-CM

## 2020-09-16 DIAGNOSIS — I10 Essential (primary) hypertension: Secondary | ICD-10-CM

## 2020-09-16 DIAGNOSIS — Z01812 Encounter for preprocedural laboratory examination: Secondary | ICD-10-CM | POA: Insufficient documentation

## 2020-09-16 DIAGNOSIS — I25110 Atherosclerotic heart disease of native coronary artery with unstable angina pectoris: Secondary | ICD-10-CM | POA: Diagnosis present

## 2020-09-16 DIAGNOSIS — Z7984 Long term (current) use of oral hypoglycemic drugs: Secondary | ICD-10-CM | POA: Diagnosis not present

## 2020-09-16 DIAGNOSIS — Z7982 Long term (current) use of aspirin: Secondary | ICD-10-CM | POA: Diagnosis not present

## 2020-09-16 DIAGNOSIS — Z79899 Other long term (current) drug therapy: Secondary | ICD-10-CM | POA: Diagnosis not present

## 2020-09-16 LAB — SARS CORONAVIRUS 2 (TAT 6-24 HRS): SARS Coronavirus 2: NEGATIVE

## 2020-09-16 MED ORDER — ATORVASTATIN CALCIUM 40 MG PO TABS: 40.0000 mg | ORAL_TABLET | Freq: Every day | ORAL | 2 refills | Status: DC

## 2020-09-16 MED ORDER — NITROGLYCERIN 0.4 MG SL SUBL: 0.4000 mg | SUBLINGUAL_TABLET | SUBLINGUAL | 3 refills | Status: AC | PRN

## 2020-09-16 MED ORDER — METOPROLOL TARTRATE 25 MG PO TABS: 25.0000 mg | ORAL_TABLET | Freq: Two times a day (BID) | ORAL | 2 refills | Status: DC

## 2020-09-16 NOTE — Progress Notes (Signed)
Primary Physician/Referring:  Cory Munch, PA-C  Patient ID: Derrick Duncan, male    DOB: 10/12/1965, 55 y.o.   MRN: [id number]  Chief Complaint  Patient presents with  . New Patient (Initial Visit)  . Chest Pain   HPI:    Derrick Duncan  is a 55 y.o. Caucasian male patient with hypertension, hyperlipidemia, type 2 diabetes mellitus, history of premature coronary disease in his father who had coronary artery disease in his early 61s previously seen by me in 2017 when he presented with chest discomfort and had a negative nuclear stress test.  He made a stat appointment to see [place] today in view of chest pain that started a week ago.  His wife who is an Therapist, sports is accompanying him today.  Patient states that over the past 10 days or so, he started experiencing diffuse chest tightness sometimes with radiation to the left or to the right shoulder area, and was associated with nausea.  He also had 1 episode of vomiting.  2 days ago he was woken up from sleep with severe chest tightness took aspirin and relax for a while and chest discomfort spontaneously subsided.  Again he has been having episodes of chest discomfort especially with exertion while at work and is relieved with rest.  Denies dyspnea, no palpitations, dizziness or syncope.  Past Medical History:  Diagnosis Date  . 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 Diabetes mellitus without complication ([place])   . Hypercholesteremia   . Hypertension   . [facility] spotted fever   . Weakness of both legs 11/2016   right arm also   Past Surgical History:  Procedure Laterality Date  . COLONOSCOPY N/A 07/04/2019   Procedure: COLONOSCOPY;  Surgeon: Daneil Dolin, MD;  Location: [facility];  Service: Endoscopy;  Laterality: N/A;  2:00  . CRANIOTOMY     subdural hematoma   . TONSILLECTOMY     Family History  Problem Relation Age of Onset  . Diabetes Father   . High blood pressure Sister    . Stroke Sister   . Kidney disease Sister     Social History   Tobacco Use  . Smoking status: Never Smoker  . Smokeless tobacco: Former Systems developer    Types: Chew  Substance Use Topics  . Alcohol use: Not Currently    Alcohol/week: 1.0 - 2.0 standard drink    Types: 1 - 2 Standard drinks or equivalent per week    Comment: 2 beers a week   Marital Status: Married  ROS  Review of Systems  Cardiovascular: Positive for chest pain. Negative for dyspnea on exertion and leg swelling.  Musculoskeletal: Positive for back pain.  Gastrointestinal: Negative for melena.   Objective  Blood pressure (!) 132/94, pulse 88, temperature 97.9 F (36.6 C), temperature source Temporal, resp. rate 16, height 6' (1.829 m), weight 228 lb 12.8 oz (103.8 kg), SpO2 98 %.  Vitals with BMI 09/16/2020 09/16/2020 07/04/2019  Height - 6' 0" -  Weight - 228 lbs 13 oz -  BMI - 54.56 -  Systolic 256 389 373  Diastolic 94 99 79  Pulse 88 94 51     Physical Exam Constitutional:      General: He is not in acute distress.    Comments: Mildly obese  Cardiovascular:     Rate and Rhythm: Normal rate and regular rhythm.     Pulses: Intact distal pulses.  Heart sounds: Normal heart sounds. No murmur heard. No gallop.      Comments: No leg edema, no JVD. Pulmonary:     Effort: Pulmonary effort is normal.     Breath sounds: Normal breath sounds.  Abdominal:     General: Bowel sounds are normal.     Palpations: Abdomen is soft.    Laboratory examination:   No results for input(s): NA, K, CL, CO2, GLUCOSE, BUN, CREATININE, CALCIUM, GFRNONAA, GFRAA in the last 8760 hours. CrCl cannot be calculated (Patient's most recent lab result is older than the maximum 21 days allowed.).  CMP Latest Ref Rng & Units 11/16/2016 11/15/2016 11/14/2016  Glucose 65 - 99 mg/dL 107(H) 100(H) 138(H)  BUN 6 - 20 mg/dL _0 Creatinine 0.61 - 1.24 mg/dL 0.75 0.75 0.86  Sodium 135 - 145 mmol/L 137 141 139  Potassium 3.5 - 5.1 mmol/L 4.0  4.1 3.8  Chloride 101 - 111 mmol/L 104 107 108  CO2 22 - 32 mmol/L _1 Calcium 8.9 - 10.3 mg/dL 9.3 9.1 9.0  Total Protein 6.5 - 8.1 g/dL - - 6.6  Total Bilirubin 0.3 - 1.2 mg/dL - - 0.5  Alkaline Phos 38 - 126 U/L - - 82  AST 15 - 41 U/L - - 27  ALT 17 - 63 U/L - - 48   CBC Latest Ref Rng & Units 11/16/2016 11/15/2016 11/14/2016  WBC 4.0 - 10.5 K/uL 8.3 8.7 10.0  Hemoglobin 13.0 - 17.0 g/dL 11.0(L) 10.5(L) 10.6(L)  Hematocrit 39.0 - 52.0 % 33.7(L) 32.8(L) 31.8(L)  Platelets 150 - 400 K/uL 438(H) 386 378    Lipid Panel No results for input(s): CHOL, TRIG, LDLCALC, VLDL, HDL, CHOLHDL, LDLDIRECT in the last 8760 hours.  HEMOGLOBIN A1C No results found for: HGBA1C, MPG TSH No results for input(s): TSH in the last 8760 hours.  External labs:   Not available  Medications and allergies  No Known Allergies   Outpatient Medications Prior to Visit  Medication Sig Dispense Refill  . aspirin 81 MG tablet Take 81 mg by mouth daily.    . magnesium oxide (MAG-OX) 400 MG tablet Take 400 mg by mouth daily.    . metFORMIN (GLUCOPHAGE) 1000 MG tablet Take 0.5 tablets (500 mg total) by mouth 2 (two) times daily. (Patient taking differently: Take 500 mg by mouth 2 (two) times daily with a meal.) 60 tablet 0  . valACYclovir (VALTREX) 500 MG tablet Take 500 mg by mouth daily as needed (outbreaks).    Marland Kitchen lisinopril (PRINIVIL,ZESTRIL) 40 MG tablet Take 40 mg by mouth every evening.    . simvastatin (ZOCOR) 40 MG tablet Take 40 mg by mouth every morning.     . bismuth subsalicylate (PEPTO BISMOL) 262 MG chewable tablet Chew 524 mg by mouth as needed for indigestion.     No facility-administered medications prior to visit.    Radiology:   No results found.  Cardiac Studies:    Exercise sestamibi stress test 11/21/2015: 1. The resting electrocardiogram demonstrated normal sinus rhythm, normal resting conduction, no resting arrhythmias and normal rest repolarization. The stress  electrocardiogram was normal. Patient exercised on Bruce protocol for 8:15 minutes and achieved 10.16 METS. Stress test terminated due to target heart rate( 89% MPHR). Stress symptoms included dyspnea and fatigue. 2. Myocardial perfusion imaging is normal. Overall left ventricular systolic function was normal without regional wall motion abnormalities. The left ventricular ejection fraction was 50%.   EKG:     EKG  09/16/2020: Normal sinus rhythm with rate of 92 bpm, borderline left atrial abnormality, normal axis.  No evidence of ischemia, normal EKG.    EKG 12/11/2015: Sinus rhythm at a rate of 66 bpm, normal axis, normal intervals, nonspecific T-wave abnormality in leads III and aVF. No significant change from EKG on 11/18/2015.  Assessment     ICD-10-CM   1. Unstable angina (HCC) - New onset  I20.0 EKG 12-Lead    metoprolol tartrate (LOPRESSOR) 25 MG tablet    nitroGLYCERIN (NITROSTAT) 0.4 MG SL tablet    CBC    CMP14+EGFR    PCV ECHOCARDIOGRAM COMPLETE  2. Primary hypertension  I10 TSH  3. Hypercholesteremia  E78.00 atorvastatin (LIPITOR) 40 MG tablet    Lipid Panel With LDL/HDL Ratio  4. Family history of premature CAD Father with CAD at age 63s.   Z82.49      Medications Discontinued During This Encounter  Medication Reason  . bismuth subsalicylate (PEPTO BISMOL) 262 MG chewable tablet Error  . simvastatin (ZOCOR) 40 MG tablet Change in therapy    Meds ordered this encounter  Medications  . metoprolol tartrate (LOPRESSOR) 25 MG tablet    Sig: Take 1 tablet (25 mg total) by mouth 2 (two) times daily.    Dispense:  60 tablet    Refill:  2  . nitroGLYCERIN (NITROSTAT) 0.4 MG SL tablet    Sig: Place 1 tablet (0.4 mg total) under the tongue every 5 (five) minutes as needed for up to 25 days for chest pain.    Dispense:  25 tablet    Refill:  3  . atorvastatin (LIPITOR) 40 MG tablet    Sig: Take 1 tablet (40 mg total) by mouth daily.    Dispense:  30 tablet    Refill:  2    Orders Placed This Encounter  Procedures  . CBC  . CMP14+EGFR  . Lipid Panel With LDL/HDL Ratio  . TSH  . EKG 12-Lead  . PCV ECHOCARDIOGRAM COMPLETE    Standing Status:   Future    Standing Expiration Date:   09/16/2021   Recommendations:   Derrill Bagnell MCDOWELL III is a 55 y.o. Caucasian male patient with hypertension, hyperlipidemia, type 2 diabetes mellitus, history of premature coronary disease in his father who had coronary artery disease in his early 74s previously seen by me in 2017 when he presented with chest discomfort and had a negative nuclear stress test.  He made a stat appointment to see [place] today in view of chest pain that started a week ago.  Chest pain symptoms associated with nausea and coming on during exertion over the past 10 days or so is very suggestive of intermediate coronary syndrome. S/L NTG was prescribed and explained how to and when to use it and to notify us if there is change in frequency of use. Interaction with cialis-like agents (if applicable was discussed). Patient instructed not to do heavy lifting, heavy exertional activity, swimming until evaluation is complete.  Patient instructed to call if symptoms worse or to go to the ED for further evaluation.   Discontinue simvastatin to be aggressive with statin therapy, switch him to atorvastatin 40 mg daily.  Continue aspirin.  I also started him on metoprolol tartrate 25 mg p.o. twice daily both for elevated heart rate and uncontrolled hypertension.  Patient states that blood pressure has been previously well controlled.  Suspect anxiety and chest pain could be contributing to uncontrolled hypertension.  Will schedule for an echocardiogram. Schedule  for cardiac catheterization, and possible angioplasty. We discussed regarding risks, benefits, alternatives to this including stress testing, CTA and continued medical therapy.  In view of acute onset of symptoms and symptoms occurring almost on a regular basis even with  minimal activity and multiple cardiac risk factors including uncontrolled hypertension today, tachycardia, history of hyperlipidemia and diabetes, patient wants to proceed. Understands <1-2% risk of death, stroke, MI, urgent CABG, bleeding, infection, renal failure but not limited to these.  Office visit following the work-up/investigations.  This was a 60-minute office visit encounter with discussions regarding procedure, addressing multiple medical issues.    Adrian Prows, MD, [facility] 09/16/2020, 5:20 PM Office: [phone]

## 2020-09-16 NOTE — H&P (View-Only) (Signed)
Primary Physician/Referring:  Cory Munch, PA-C  Patient ID: Derrick Duncan, male    DOB: 10/12/1965, 55 y.o.   MRN: [id number]  Chief Complaint  Patient presents with  . New Patient (Initial Visit)  . Chest Pain   HPI:    Derrick Duncan  is a 55 y.o. Caucasian male patient with hypertension, hyperlipidemia, type 2 diabetes mellitus, history of premature coronary disease in his father who had coronary artery disease in his early 61s previously seen by me in 2017 when he presented with chest discomfort and had a negative nuclear stress test.  He made a stat appointment to see [place] today in view of chest pain that started a week ago.  His wife who is an Therapist, sports is accompanying him today.  Patient states that over the past 10 days or so, he started experiencing diffuse chest tightness sometimes with radiation to the left or to the right shoulder area, and was associated with nausea.  He also had 1 episode of vomiting.  2 days ago he was woken up from sleep with severe chest tightness took aspirin and relax for a while and chest discomfort spontaneously subsided.  Again he has been having episodes of chest discomfort especially with exertion while at work and is relieved with rest.  Denies dyspnea, no palpitations, dizziness or syncope.  Past Medical History:  Diagnosis Date  . 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 Diabetes mellitus without complication ([place])   . Hypercholesteremia   . Hypertension   . [facility] spotted fever   . Weakness of both legs 11/2016   right arm also   Past Surgical History:  Procedure Laterality Date  . COLONOSCOPY N/A 07/04/2019   Procedure: COLONOSCOPY;  Surgeon: Daneil Dolin, MD;  Location: [facility];  Service: Endoscopy;  Laterality: N/A;  2:00  . CRANIOTOMY     subdural hematoma   . TONSILLECTOMY     Family History  Problem Relation Age of Onset  . Diabetes Father   . High blood pressure Sister    . Stroke Sister   . Kidney disease Sister     Social History   Tobacco Use  . Smoking status: Never Smoker  . Smokeless tobacco: Former Systems developer    Types: Chew  Substance Use Topics  . Alcohol use: Not Currently    Alcohol/week: 1.0 - 2.0 standard drink    Types: 1 - 2 Standard drinks or equivalent per week    Comment: 2 beers a week   Marital Status: Married  ROS  Review of Systems  Cardiovascular: Positive for chest pain. Negative for dyspnea on exertion and leg swelling.  Musculoskeletal: Positive for back pain.  Gastrointestinal: Negative for melena.   Objective  Blood pressure (!) 132/94, pulse 88, temperature 97.9 F (36.6 C), temperature source Temporal, resp. rate 16, height 6' (1.829 m), weight 228 lb 12.8 oz (103.8 kg), SpO2 98 %.  Vitals with BMI 09/16/2020 09/16/2020 07/04/2019  Height - 6' 0" -  Weight - 228 lbs 13 oz -  BMI - 54.56 -  Systolic 256 389 373  Diastolic 94 99 79  Pulse 88 94 51     Physical Exam Constitutional:      General: He is not in acute distress.    Comments: Mildly obese  Cardiovascular:     Rate and Rhythm: Normal rate and regular rhythm.     Pulses: Intact distal pulses.  Heart sounds: Normal heart sounds. No murmur heard. No gallop.      Comments: No leg edema, no JVD. Pulmonary:     Effort: Pulmonary effort is normal.     Breath sounds: Normal breath sounds.  Abdominal:     General: Bowel sounds are normal.     Palpations: Abdomen is soft.    Laboratory examination:   No results for input(s): NA, K, CL, CO2, GLUCOSE, BUN, CREATININE, CALCIUM, GFRNONAA, GFRAA in the last 8760 hours. CrCl cannot be calculated (Patient's most recent lab result is older than the maximum 21 days allowed.).  CMP Latest Ref Rng & Units 11/16/2016 11/15/2016 11/14/2016  Glucose 65 - 99 mg/dL 107(H) 100(H) 138(H)  BUN 6 - 20 mg/dL 14 13 17  Creatinine 0.61 - 1.24 mg/dL 0.75 0.75 0.86  Sodium 135 - 145 mmol/L 137 141 139  Potassium 3.5 - 5.1 mmol/L 4.0  4.1 3.8  Chloride 101 - 111 mmol/L 104 107 108  CO2 22 - 32 mmol/L 27 27 25  Calcium 8.9 - 10.3 mg/dL 9.3 9.1 9.0  Total Protein 6.5 - 8.1 g/dL - - 6.6  Total Bilirubin 0.3 - 1.2 mg/dL - - 0.5  Alkaline Phos 38 - 126 U/L - - 82  AST 15 - 41 U/L - - 27  ALT 17 - 63 U/L - - 48   CBC Latest Ref Rng & Units 11/16/2016 11/15/2016 11/14/2016  WBC 4.0 - 10.5 K/uL 8.3 8.7 10.0  Hemoglobin 13.0 - 17.0 g/dL 11.0(L) 10.5(L) 10.6(L)  Hematocrit 39.0 - 52.0 % 33.7(L) 32.8(L) 31.8(L)  Platelets 150 - 400 K/uL 438(H) 386 378    Lipid Panel No results for input(s): CHOL, TRIG, LDLCALC, VLDL, HDL, CHOLHDL, LDLDIRECT in the last 8760 hours.  HEMOGLOBIN A1C No results found for: HGBA1C, MPG TSH No results for input(s): TSH in the last 8760 hours.  External labs:   Not available  Medications and allergies  No Known Allergies   Outpatient Medications Prior to Visit  Medication Sig Dispense Refill  . aspirin 81 MG tablet Take 81 mg by mouth daily.    . magnesium oxide (MAG-OX) 400 MG tablet Take 400 mg by mouth daily.    . metFORMIN (GLUCOPHAGE) 1000 MG tablet Take 0.5 tablets (500 mg total) by mouth 2 (two) times daily. (Patient taking differently: Take 500 mg by mouth 2 (two) times daily with a meal.) 60 tablet 0  . valACYclovir (VALTREX) 500 MG tablet Take 500 mg by mouth daily as needed (outbreaks).    . lisinopril (PRINIVIL,ZESTRIL) 40 MG tablet Take 40 mg by mouth every evening.    . simvastatin (ZOCOR) 40 MG tablet Take 40 mg by mouth every morning.     . bismuth subsalicylate (PEPTO BISMOL) 262 MG chewable tablet Chew 524 mg by mouth as needed for indigestion.     No facility-administered medications prior to visit.    Radiology:   No results found.  Cardiac Studies:    Exercise sestamibi stress test 11/21/2015: 1. The resting electrocardiogram demonstrated normal sinus rhythm, normal resting conduction, no resting arrhythmias and normal rest repolarization. The stress  electrocardiogram was normal. Patient exercised on Bruce protocol for 8:15 minutes and achieved 10.16 METS. Stress test terminated due to target heart rate( 89% MPHR). Stress symptoms included dyspnea and fatigue. 2. Myocardial perfusion imaging is normal. Overall left ventricular systolic function was normal without regional wall motion abnormalities. The left ventricular ejection fraction was 50%.   EKG:     EKG   09/16/2020: Normal sinus rhythm with rate of 92 bpm, borderline left atrial abnormality, normal axis.  No evidence of ischemia, normal EKG.    EKG 12/11/2015: Sinus rhythm at a rate of 66 bpm, normal axis, normal intervals, nonspecific T-wave abnormality in leads III and aVF. No significant change from EKG on 11/18/2015.  Assessment     ICD-10-CM   1. Unstable angina (HCC) - New onset  I20.0 EKG 12-Lead    metoprolol tartrate (LOPRESSOR) 25 MG tablet    nitroGLYCERIN (NITROSTAT) 0.4 MG SL tablet    CBC    CMP14+EGFR    PCV ECHOCARDIOGRAM COMPLETE  2. Primary hypertension  I10 TSH  3. Hypercholesteremia  E78.00 atorvastatin (LIPITOR) 40 MG tablet    Lipid Panel With LDL/HDL Ratio  4. Family history of premature CAD Father with CAD at age 50s.   Z82.49      Medications Discontinued During This Encounter  Medication Reason  . bismuth subsalicylate (PEPTO BISMOL) 262 MG chewable tablet Error  . simvastatin (ZOCOR) 40 MG tablet Change in therapy    Meds ordered this encounter  Medications  . metoprolol tartrate (LOPRESSOR) 25 MG tablet    Sig: Take 1 tablet (25 mg total) by mouth 2 (two) times daily.    Dispense:  60 tablet    Refill:  2  . nitroGLYCERIN (NITROSTAT) 0.4 MG SL tablet    Sig: Place 1 tablet (0.4 mg total) under the tongue every 5 (five) minutes as needed for up to 25 days for chest pain.    Dispense:  25 tablet    Refill:  3  . atorvastatin (LIPITOR) 40 MG tablet    Sig: Take 1 tablet (40 mg total) by mouth daily.    Dispense:  30 tablet    Refill:  2    Orders Placed This Encounter  Procedures  . CBC  . CMP14+EGFR  . Lipid Panel With LDL/HDL Ratio  . TSH  . EKG 12-Lead  . PCV ECHOCARDIOGRAM COMPLETE    Standing Status:   Future    Standing Expiration Date:   09/16/2021   Recommendations:   Graysyn M MCDOWELL III is a 54 y.o. Caucasian male patient with hypertension, hyperlipidemia, type 2 diabetes mellitus, history of premature coronary disease in his father who had coronary artery disease in his early 50s previously seen by me in 2017 when he presented with chest discomfort and had a negative nuclear stress test.  He made a stat appointment to see us today in view of chest pain that started a week ago.  Chest pain symptoms associated with nausea and coming on during exertion over the past 10 days or so is very suggestive of intermediate coronary syndrome. S/L NTG was prescribed and explained how to and when to use it and to notify us if there is change in frequency of use. Interaction with cialis-like agents (if applicable was discussed). Patient instructed not to do heavy lifting, heavy exertional activity, swimming until evaluation is complete.  Patient instructed to call if symptoms worse or to go to the ED for further evaluation.   Discontinue simvastatin to be aggressive with statin therapy, switch him to atorvastatin 40 mg daily.  Continue aspirin.  I also started him on metoprolol tartrate 25 mg p.o. twice daily both for elevated heart rate and uncontrolled hypertension.  Patient states that blood pressure has been previously well controlled.  Suspect anxiety and chest pain could be contributing to uncontrolled hypertension.  Will schedule for an echocardiogram. Schedule   for cardiac catheterization, and possible angioplasty. We discussed regarding risks, benefits, alternatives to this including stress testing, CTA and continued medical therapy.  In view of acute onset of symptoms and symptoms occurring almost on a regular basis even with  minimal activity and multiple cardiac risk factors including uncontrolled hypertension today, tachycardia, history of hyperlipidemia and diabetes, patient wants to proceed. Understands <1-2% risk of death, stroke, MI, urgent CABG, bleeding, infection, renal failure but not limited to these.  Office visit following the work-up/investigations.  This was a 60-minute office visit encounter with discussions regarding procedure, addressing multiple medical issues.    Zoejane Gaulin, MD, FACC 09/16/2020, 5:20 PM Office: [phone] 

## 2020-09-17 ENCOUNTER — Ambulatory Visit: Payer: BC Managed Care – PPO

## 2020-09-17 ENCOUNTER — Other Ambulatory Visit: Payer: BC Managed Care – PPO

## 2020-09-17 DIAGNOSIS — I200 Unstable angina: Secondary | ICD-10-CM

## 2020-09-17 DIAGNOSIS — I208 Other forms of angina pectoris: Secondary | ICD-10-CM

## 2020-09-17 LAB — CMP14+EGFR
ALT: 21 IU/L (ref 0–44)
AST: 14 IU/L (ref 0–40)
Albumin/Globulin Ratio: 2.1 (ref 1.2–2.2)
Albumin: 5 g/dL — ABNORMAL HIGH (ref 3.8–4.9)
Alkaline Phosphatase: 104 IU/L (ref 44–121)
BUN/Creatinine Ratio: 14 (ref 9–20)
BUN: 12 mg/dL (ref 6–24)
Bilirubin Total: 1.5 mg/dL — ABNORMAL HIGH (ref 0.0–1.2)
CO2: 24 mmol/L (ref 20–29)
Calcium: 10.4 mg/dL — ABNORMAL HIGH (ref 8.7–10.2)
Chloride: 99 mmol/L (ref 96–106)
Creatinine, Ser: 0.86 mg/dL (ref 0.76–1.27)
GFR calc Af Amer: 114 mL/min/{1.73_m2} (ref >59)
GFR calc non Af Amer: 99 mL/min/{1.73_m2} (ref >59)
Globulin, Total: 2.4 g/dL (ref 1.5–4.5)
Glucose: 212 mg/dL — ABNORMAL HIGH (ref 65–99)
Potassium: 4.6 mmol/L (ref 3.5–5.2)
Sodium: 138 mmol/L (ref 134–144)
Total Protein: 7.4 g/dL (ref 6.0–8.5)

## 2020-09-17 LAB — CBC
Hematocrit: 45.4 % (ref 37.5–51.0)
Hemoglobin: 15.3 g/dL (ref 13.0–17.7)
MCH: 30.5 pg (ref 26.6–33.0)
MCHC: 33.7 g/dL (ref 31.5–35.7)
MCV: 91 fL (ref 79–97)
Platelets: 300 10*3/uL (ref 150–450)
RBC: 5.01 x10E6/uL (ref 4.14–5.80)
RDW: 12.3 % (ref 11.6–15.4)
WBC: 6.5 10*3/uL (ref 3.4–10.8)

## 2020-09-17 LAB — LIPID PANEL WITH LDL/HDL RATIO
Cholesterol, Total: 192 mg/dL (ref 100–199)
HDL: 47 mg/dL (ref >39)
LDL Chol Calc (NIH): 120 mg/dL — ABNORMAL HIGH (ref 0–99)
LDL/HDL Ratio: 2.6 ratio (ref 0.0–3.6)
Triglycerides: 143 mg/dL (ref 0–149)
VLDL Cholesterol Cal: 25 mg/dL (ref 5–40)

## 2020-09-17 LAB — TSH: TSH: 0.638 u[IU]/mL (ref 0.450–4.500)

## 2020-09-18 ENCOUNTER — Other Ambulatory Visit: Payer: Self-pay

## 2020-09-18 ENCOUNTER — Encounter (HOSPITAL_COMMUNITY): Payer: Self-pay | Admitting: Cardiology

## 2020-09-18 ENCOUNTER — Encounter: Payer: Self-pay | Admitting: Cardiology

## 2020-09-18 ENCOUNTER — Ambulatory Visit (HOSPITAL_COMMUNITY)
Admission: RE | Admit: 2020-09-18 | Discharge: 2020-09-18 | Disposition: A | Discharge status: Home / Self Care | Payer: BC Managed Care – PPO | Attending: Cardiology | Admitting: Cardiology

## 2020-09-18 ENCOUNTER — Encounter (HOSPITAL_COMMUNITY)
Admission: RE | Disposition: A | Discharge status: Home / Self Care | Payer: Self-pay | Source: Home / Self Care | Attending: Cardiology

## 2020-09-18 DIAGNOSIS — E7800 Pure hypercholesterolemia, unspecified: Secondary | ICD-10-CM | POA: Insufficient documentation

## 2020-09-18 DIAGNOSIS — Z8249 Family history of ischemic heart disease and other diseases of the circulatory system: Secondary | ICD-10-CM | POA: Insufficient documentation

## 2020-09-18 DIAGNOSIS — I2089 Other forms of angina pectoris: Secondary | ICD-10-CM

## 2020-09-18 DIAGNOSIS — I200 Unstable angina: Secondary | ICD-10-CM | POA: Diagnosis present

## 2020-09-18 DIAGNOSIS — Z20822 Contact with and (suspected) exposure to covid-19: Secondary | ICD-10-CM | POA: Insufficient documentation

## 2020-09-18 DIAGNOSIS — I208 Other forms of angina pectoris: Secondary | ICD-10-CM

## 2020-09-18 DIAGNOSIS — I25110 Atherosclerotic heart disease of native coronary artery with unstable angina pectoris: Secondary | ICD-10-CM | POA: Diagnosis not present

## 2020-09-18 DIAGNOSIS — I10 Essential (primary) hypertension: Secondary | ICD-10-CM | POA: Insufficient documentation

## 2020-09-18 DIAGNOSIS — Z7984 Long term (current) use of oral hypoglycemic drugs: Secondary | ICD-10-CM | POA: Insufficient documentation

## 2020-09-18 DIAGNOSIS — Z7982 Long term (current) use of aspirin: Secondary | ICD-10-CM | POA: Insufficient documentation

## 2020-09-18 DIAGNOSIS — Z79899 Other long term (current) drug therapy: Secondary | ICD-10-CM | POA: Insufficient documentation

## 2020-09-18 HISTORY — PX: LEFT HEART CATH AND CORONARY ANGIOGRAPHY: CATH118249

## 2020-09-18 LAB — GLUCOSE, CAPILLARY: Glucose-Capillary: 277 mg/dL — ABNORMAL HIGH (ref 70–99)

## 2020-09-18 SURGERY — LEFT HEART CATH AND CORONARY ANGIOGRAPHY
Anesthesia: LOCAL

## 2020-09-18 MED ORDER — SODIUM CHLORIDE 0.9 % IV SOLN: 250.0000 mL | INTRAVENOUS | Status: DC | PRN

## 2020-09-18 MED ORDER — MIDAZOLAM HCL 2 MG/2ML IJ SOLN
INTRAMUSCULAR | Status: AC
  Filled 2020-09-18: qty 2

## 2020-09-18 MED ORDER — ASPIRIN 81 MG PO CHEW: 81.0000 mg | CHEWABLE_TABLET | ORAL | Status: DC

## 2020-09-18 MED ORDER — SODIUM CHLORIDE 0.9% FLUSH: 3.0000 mL | Freq: Two times a day (BID) | INTRAVENOUS | Status: DC

## 2020-09-18 MED ORDER — NITROGLYCERIN 1 MG/10 ML FOR IR/CATH LAB
INTRA_ARTERIAL | Status: DC | PRN
  Administered 2020-09-18 (×2): 100 ug via INTRACORONARY
  Administered 2020-09-18: 150 ug via INTRACORONARY

## 2020-09-18 MED ORDER — HEPARIN SODIUM (PORCINE) 1000 UNIT/ML IJ SOLN
INTRAMUSCULAR | Status: DC | PRN
  Administered 2020-09-18: 5000 [IU] via INTRAVENOUS

## 2020-09-18 MED ORDER — MIDAZOLAM HCL 2 MG/2ML IJ SOLN
INTRAMUSCULAR | Status: DC | PRN
  Administered 2020-09-18: 2 mg via INTRAVENOUS

## 2020-09-18 MED ORDER — METFORMIN HCL 1000 MG PO TABS: 500.0000 mg | ORAL_TABLET | Freq: Two times a day (BID) | ORAL | Status: DC

## 2020-09-18 MED ORDER — HEPARIN (PORCINE) IN NACL 1000-0.9 UT/500ML-% IV SOLN
INTRAVENOUS | Status: AC
  Filled 2020-09-18: qty 1000

## 2020-09-18 MED ORDER — SODIUM CHLORIDE 0.9 % WEIGHT BASED INFUSION: 1.0000 mL/kg/h | INTRAVENOUS | Status: DC

## 2020-09-18 MED ORDER — ONDANSETRON HCL 4 MG/2ML IJ SOLN: 4.0000 mg | Freq: Four times a day (QID) | INTRAMUSCULAR | Status: DC | PRN

## 2020-09-18 MED ORDER — FENTANYL CITRATE (PF) 100 MCG/2ML IJ SOLN
INTRAMUSCULAR | Status: DC | PRN
  Administered 2020-09-18: 25 ug via INTRAVENOUS

## 2020-09-18 MED ORDER — ACETAMINOPHEN 325 MG PO TABS: 650.0000 mg | ORAL_TABLET | ORAL | Status: DC | PRN

## 2020-09-18 MED ORDER — LIDOCAINE HCL (PF) 1 % IJ SOLN
INTRAMUSCULAR | Status: DC | PRN
  Administered 2020-09-18: 2 mL

## 2020-09-18 MED ORDER — SODIUM CHLORIDE 0.9 % IV SOLN
INTRAVENOUS | Status: AC | PRN
  Administered 2020-09-18: 500 mL via INTRAVENOUS

## 2020-09-18 MED ORDER — NITROGLYCERIN 1 MG/10 ML FOR IR/CATH LAB
INTRA_ARTERIAL | Status: AC
  Filled 2020-09-18: qty 10

## 2020-09-18 MED ORDER — IOHEXOL 350 MG/ML SOLN
INTRAVENOUS | Status: DC | PRN
  Administered 2020-09-18: 60 mL via INTRA_ARTERIAL

## 2020-09-18 MED ORDER — SODIUM CHLORIDE 0.9% FLUSH: 3.0000 mL | INTRAVENOUS | Status: DC | PRN

## 2020-09-18 MED ORDER — VERAPAMIL HCL 2.5 MG/ML IV SOLN
INTRAVENOUS | Status: AC
  Filled 2020-09-18: qty 2

## 2020-09-18 MED ORDER — SODIUM CHLORIDE 0.9 % WEIGHT BASED INFUSION
3.0000 mL/kg/h | INTRAVENOUS | Status: DC
  Administered 2020-09-18: 3 mL/kg/h via INTRAVENOUS

## 2020-09-18 MED ORDER — HEPARIN (PORCINE) IN NACL 1000-0.9 UT/500ML-% IV SOLN
INTRAVENOUS | Status: DC | PRN
  Administered 2020-09-18 (×2): 500 mL

## 2020-09-18 MED ORDER — FENTANYL CITRATE (PF) 100 MCG/2ML IJ SOLN
INTRAMUSCULAR | Status: AC
  Filled 2020-09-18: qty 2

## 2020-09-18 MED ORDER — VERAPAMIL HCL 2.5 MG/ML IV SOLN
INTRAVENOUS | Status: DC | PRN
  Administered 2020-09-18: 10 mL via INTRA_ARTERIAL

## 2020-09-18 MED ORDER — HEPARIN SODIUM (PORCINE) 1000 UNIT/ML IJ SOLN
INTRAMUSCULAR | Status: AC
  Filled 2020-09-18: qty 1

## 2020-09-18 MED ORDER — LIDOCAINE HCL (PF) 1 % IJ SOLN
INTRAMUSCULAR | Status: AC
  Filled 2020-09-18: qty 30

## 2020-09-18 SURGICAL SUPPLY — 12 items
BAG SNAP BAND KOVER 36X36 (MISCELLANEOUS) ×2 IMPLANT
CATH INFINITI JR4 5F (CATHETERS) ×2 IMPLANT
CATH OPTITORQUE TIG 4.0 5F (CATHETERS) ×2 IMPLANT
COVER DOME SNAP 22 D (MISCELLANEOUS) ×2 IMPLANT
DEVICE RAD COMP TR BAND LRG (VASCULAR PRODUCTS) ×2 IMPLANT
GLIDESHEATH SLEND A-KIT 6F 22G (SHEATH) ×2 IMPLANT
GUIDEWIRE INQWIRE 1.5J.035X260 (WIRE) ×1 IMPLANT
INQWIRE 1.5J .035X260CM (WIRE) ×2
KIT HEART LEFT (KITS) ×2 IMPLANT
PACK CARDIAC CATHETERIZATION (CUSTOM PROCEDURE TRAY) ×2 IMPLANT
TRANSDUCER W/STOPCOCK (MISCELLANEOUS) ×2 IMPLANT
TUBING CIL FLEX 10 FLL-RA (TUBING) ×2 IMPLANT

## 2020-09-18 NOTE — Discharge Instructions (Signed)
Do not return to work until 09/29/2020.  HOLD METFORMIN FOR A FULL 48 HOURS AFTER DISCHARGE.  Radial Site Care  This sheet gives you information about how to care for yourself after your procedure. Your health care provider may also give you more specific instructions. If you have problems or questions, contact your health care provider. What can I expect after the procedure? After the procedure, it is common to have:  Bruising and tenderness at the catheter insertion area. Follow these instructions at home: Medicines  Take over-the-counter and prescription medicines only as told by your health care provider. Insertion site care 1. Follow instructions from your health care provider about how to take care of your insertion site. Make sure you: ? Wash your hands with soap and water before you change your bandage (dressing). If soap and water are not available, use hand sanitizer. ? Remove your dressing as told by your health care provider. In 24 hours 2. Check your insertion site every day for signs of infection. Check for: ? Redness, swelling, or pain. ? Fluid or blood. ? Pus or a bad smell. ? Warmth. 3. Do not take baths, swim, or use a hot tub for 5 days. 4. You may shower 24-48 hours after the procedure. ? Remove the dressing and gently wash the site with plain soap and water. ? Pat the area dry with a clean towel. ? Do not rub the site. That could cause bleeding. 5. Do not apply powder or lotion to the site. Activity  1. For 24 hours after the procedure, or as directed by your health care provider: ? Do not flex or bend the affected arm. ? Do not push or pull heavy objects with the affected arm. ? Do not drive yourself home from the hospital or clinic. You may drive 24 hours after the procedure. ? Do not operate machinery or power tools. ? KEEP ARM ELEVATED THE REMAINDER OF THE DAY. 2. Do not push, pull or lift anything that is heavier than 10 lb for 5 days. 3. Ask your  health care provider when it is okay to: ? Return to work or school. ? Resume usual physical activities or sports. ? Resume sexual activity. General instructions  If the catheter site starts to bleed, raise your arm and put firm pressure on the site. If the bleeding does not stop, get help right away. This is a medical emergency.  DRINK PLENTY OF FLUIDS FOR THE NEXT 2-3 DAYS.  If you went home on the same day as your procedure, a responsible adult should be with you for the first 24 hours after you arrive home.  Keep all follow-up visits as told by your health care provider. This is important. Contact a health care provider if:  You have a fever.  You have redness, swelling, or yellow drainage around your insertion site. Get help right away if:  You have unusual pain at the radial site.  The catheter insertion area swells very fast.  The insertion area is bleeding, and the bleeding does not stop when you hold steady pressure on the area.  Your arm or hand becomes pale, cool, tingly, or numb. These symptoms may represent a serious problem that is an emergency. Do not wait to see if the symptoms will go away. Get medical help right away. Call your local emergency services (911 in the [place] Do not drive yourself to the hospital. Summary  After the procedure, it is common to have bruising and tenderness at  the site.  Follow instructions from your health care provider about how to take care of your radial site wound. Check the wound every day for signs of infection.  Do not push, pull or lift anything that is heavier than 10 lb for 5 days.  This information is not intended to replace advice given to you by your health care provider. Make sure you discuss any questions you have with your health care provider. Document Revised: 09/07/2017 Document Reviewed: 09/07/2017 Elsevier Patient Education  2020 [employer].

## 2020-09-18 NOTE — Interval H&P Note (Signed)
History and Physical Interval Note:  09/18/2020 7:38 AM  Melrose Nakayama MCDOWELL III  has presented today for surgery, with the diagnosis of angina.  The various methods of treatment have been discussed with the patient and family. After consideration of risks, benefits and other options for treatment, the patient has consented to  Procedure(s): LEFT HEART CATH AND CORONARY ANGIOGRAPHY (N/A) and possible angioplastyas a surgical intervention.  The patient's history has been reviewed, patient examined, no change in status, stable for surgery.  I have reviewed the patient's chart and labs.  Questions were answered to the patient's satisfaction.   Cath Lab Visit (complete for each Cath Lab visit)  Clinical Evaluation Leading to the Procedure:   ACS: Yes.    Non-ACS:    Anginal Classification: CCS IV  Anti-ischemic medical therapy: Minimal Therapy (1 class of medications)  Non-Invasive Test Results: No non-invasive testing performed  Prior CABG: No previous CABG [facility]

## 2020-09-19 ENCOUNTER — Telehealth: Payer: Self-pay

## 2020-09-19 NOTE — Telephone Encounter (Signed)
Pts wife called regarding pts medication. She stated that last time they came to see you, you added amlodipine to him medication list. The pharmacy did not have the prescription and I do not see it on his chart. She would also like to know if he should continue taking his lisinopril.

## 2020-09-20 NOTE — Telephone Encounter (Signed)
I took care of it and sent My Chart Message. No change needed

## 2020-10-14 ENCOUNTER — Ambulatory Visit: Payer: BC Managed Care – PPO | Admitting: Cardiology

## 2020-10-14 ENCOUNTER — Encounter: Payer: Self-pay | Admitting: Cardiology

## 2020-10-14 ENCOUNTER — Other Ambulatory Visit: Payer: Self-pay

## 2020-10-14 VITALS — BP 152/96 | HR 80 | Temp 98.7°F | Resp 17 | Ht 72.0 in | Wt 230.0 lb

## 2020-10-14 DIAGNOSIS — E7800 Pure hypercholesterolemia, unspecified: Secondary | ICD-10-CM

## 2020-10-14 DIAGNOSIS — I208 Other forms of angina pectoris: Secondary | ICD-10-CM

## 2020-10-14 DIAGNOSIS — I10 Essential (primary) hypertension: Secondary | ICD-10-CM

## 2020-10-14 MED ORDER — CARVEDILOL 6.25 MG PO TABS: 6.2500 mg | ORAL_TABLET | Freq: Two times a day (BID) | ORAL | 2 refills | Status: DC

## 2020-10-14 MED ORDER — LISINOPRIL 40 MG PO TABS: 40.0000 mg | ORAL_TABLET | Freq: Every evening | ORAL | 3 refills | Status: DC

## 2020-10-14 NOTE — Progress Notes (Signed)
Primary Physician/Referring:  Cory Munch, PA-C  Patient ID: Derrick Duncan, male    DOB: 25-Jan-1966, 55 y.o.   MRN: [id number]  Chief Complaint  Patient presents with  . Chest Pain  . Follow-up    4 week   HPI:    Derrick Duncan  is a 55 y.o. Caucasian male patient with hypertension, hyperlipidemia, type 2 diabetes mellitus, history of premature coronary disease in his father who had coronary artery disease in his early 62s previously seen by me in 2017 when he presented with chest discomfort and had a negative nuclear stress test. Due to symptom suggestive of angina.  He underwent an angiography on 09/18/2020 and was found to have moderate coronary calcification diffusely in the right and also in the left coronaries with mild ectasia in the RCA.  There is also mild luminal irregularity.  Diagnosed with microvascular angina and moderate coronary disease he now presents for follow-up.    Since being on metoprolol, states that he has not had any recurrence of chest pain.  Simvastatin switched over to atorvastatin 40 mg.  Overall he has started to feel much better.  Tolerating all his medications.  Past Medical History:  Diagnosis Date  . 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 Diabetes mellitus without complication ([place])   . Hypercholesteremia   . Hypertension   . [facility] spotted fever   . Weakness of both legs 11/2016   right arm also   Past Surgical History:  Procedure Laterality Date  . COLONOSCOPY N/A 07/04/2019   Procedure: COLONOSCOPY;  Surgeon: Daneil Dolin, MD;  Location: [facility];  Service: Endoscopy;  Laterality: N/A;  2:00  . CRANIOTOMY     subdural hematoma   . LEFT HEART CATH AND CORONARY ANGIOGRAPHY N/A 09/18/2020   Procedure: LEFT HEART CATH AND CORONARY ANGIOGRAPHY;  Surgeon: Adrian Prows, MD;  Location: [facility];  Service: Cardiovascular;  Laterality: N/A;  . TONSILLECTOMY     Family History   Problem Relation Age of Onset  . Diabetes Father   . High blood pressure Sister   . Stroke Sister   . Kidney disease Sister     Social History   Tobacco Use  . Smoking status: Never Smoker  . Smokeless tobacco: Former Systems developer    Types: Chew  Substance Use Topics  . Alcohol use: Not Currently   Marital Status: Married  ROS  Review of Systems  Constitutional: Positive for weight gain.  Cardiovascular: Negative for chest pain, dyspnea on exertion and leg swelling.  Musculoskeletal: Positive for back pain.  Gastrointestinal: Negative for melena.   Objective  Blood pressure (!) 152/96, pulse 80, temperature 98.7 F (37.1 C), temperature source Temporal, resp. rate 17, height 6' (1.829 m), weight 230 lb (104.3 kg), SpO2 99 %.  Vitals with BMI 10/14/2020 10/14/2020 09/18/2020  Height - 6' 0"  -  Weight - 230 lbs -  BMI - 36.62 -  Systolic 947 654 650  Diastolic 96 95 70  Pulse 80 77 55     Physical Exam Constitutional:      General: He is not in acute distress.    Comments: Mildly obese  Cardiovascular:     Rate and Rhythm: Normal rate and regular rhythm.     Pulses: Intact distal pulses.     Heart sounds: Normal heart sounds. No murmur heard. No gallop.      Comments: No leg edema, no JVD.  Pulmonary:     Effort: Pulmonary effort is normal.     Breath sounds: Normal breath sounds.  Abdominal:     General: Bowel sounds are normal.     Palpations: Abdomen is soft.    Laboratory examination:   Recent Labs    09/16/20 1102  NA 138  K 4.6  CL 99  CO2 24  GLUCOSE 212*  BUN 12  CREATININE 0.86  CALCIUM 10.4*  GFRNONAA 99  GFRAA 114   CrCl cannot be calculated (Patient's most recent lab result is older than the maximum 21 days allowed.).  CMP Latest Ref Rng & Units 09/16/2020 11/16/2016 11/15/2016  Glucose 65 - 99 mg/dL 212(H) 107(H) 100(H)  BUN 6 - 24 mg/dL 12 14 13   Creatinine 0.76 - 1.27 mg/dL 0.86 0.75 0.75  Sodium 134 - 144 mmol/L 138 137 141  Potassium 3.5 - 5.2  mmol/L 4.6 4.0 4.1  Chloride 96 - 106 mmol/L 99 104 107  CO2 20 - 29 mmol/L 24 27 27   Calcium 8.7 - 10.2 mg/dL 10.4(H) 9.3 9.1  Total Protein 6.0 - 8.5 g/dL 7.4 - -  Total Bilirubin 0.0 - 1.2 mg/dL 1.5(H) - -  Alkaline Phos 44 - 121 IU/L 104 - -  AST 0 - 40 IU/L 14 - -  ALT 0 - 44 IU/L 21 - -   CBC Latest Ref Rng & Units 09/16/2020 11/16/2016 11/15/2016  WBC 3.4 - 10.8 x10E3/uL 6.5 8.3 8.7  Hemoglobin 13.0 - 17.7 g/dL 15.3 11.0(L) 10.5(L)  Hematocrit 37.5 - 51.0 % 45.4 33.7(L) 32.8(L)  Platelets 150 - 450 x10E3/uL 300 438(H) 386    Lipid Panel Recent Labs    09/16/20 1102  CHOL 192  TRIG 143  LDLCALC 120*  HDL 47    HEMOGLOBIN A1C No results found for: HGBA1C, MPG TSH Recent Labs    09/16/20 1102  TSH 0.638    External labs:   Not available  Medications and allergies  No Known Allergies   Outpatient Medications Prior to Visit  Medication Sig Dispense Refill  . aspirin 81 MG tablet Take 81 mg by mouth daily.    Marland Kitchen atorvastatin (LIPITOR) 40 MG tablet Take 1 tablet (40 mg total) by mouth daily. 30 tablet 2  . cetirizine-pseudoephedrine (ZYRTEC-D) 5-120 MG tablet Take 1 tablet by mouth daily as needed for allergies.    Marland Kitchen ibuprofen (ADVIL) 200 MG tablet Take 800 mg by mouth every 6 (six) hours as needed for headache or moderate pain.    . magnesium oxide (MAG-OX) 400 MG tablet Take 400 mg by mouth daily.    . metFORMIN (GLUCOPHAGE) 1000 MG tablet Take 0.5 tablets (500 mg total) by mouth 2 (two) times daily with a meal.    . nitroGLYCERIN (NITROSTAT) 0.4 MG SL tablet Place 1 tablet (0.4 mg total) under the tongue every 5 (five) minutes as needed for up to 25 days for chest pain. 25 tablet 3  . sildenafil (VIAGRA) 25 MG tablet Take 50 mg by mouth daily as needed for erectile dysfunction.    . valACYclovir (VALTREX) 500 MG tablet Take 500 mg by mouth daily as needed (outbreaks).    . metoprolol tartrate (LOPRESSOR) 25 MG tablet Take 1 tablet (25 mg total) by mouth 2 (two) times  daily. 60 tablet 2   No facility-administered medications prior to visit.    Radiology:   No results found.  Cardiac Studies:    Exercise sestamibi stress test 11/21/2015: 1. The resting electrocardiogram demonstrated  normal sinus rhythm, normal resting conduction, no resting arrhythmias and normal rest repolarization. The stress electrocardiogram was normal. Patient exercised on Bruce protocol for 8:15 minutes and achieved 10.16 METS. Stress test terminated due to target heart rate( 89% MPHR). Stress symptoms included dyspnea and fatigue. 2. Myocardial perfusion imaging is normal. Overall left ventricular systolic function was normal without regional wall motion abnormalities. The left ventricular ejection fraction was 50%.  Echocardiogram 09/17/2020  Normal LV systolic function with EF 66%. Left ventricle cavity is normal in size. Normal global wall motion. Normal diastolic filling pattern. Calculated EF 66%. No significant valvular abnormality.  Normal echocardiogram.   Left Heart Catheterization 09/18/20:  LV: Normal size and systolic function. Normal LVEDP. No PG across the AV. RCA: Large vessel. Ectasia noted diffuse. Moderate coronary calcification diffuse and diffuse disease noted. Slow Flow improved with IC NTG. LM: Large and normal Cx: Moderate size and smooth and normal. LAD: Large proximally and diffuse disease with diffuse luminal narrowing from mid segment after D2. Mild diffuse disease in small D1 and moderate D2. Flow improved with IC NTG.   Impression: Mild to moderate diffuse coronary calcification R>L. Microvascular angina. Slow flow improved with IC NTG.  Continue medical therapy.   EKG:     EKG 09/16/2020: Normal sinus rhythm with rate of 92 bpm, borderline left atrial abnormality, normal axis.  No evidence of ischemia, normal EKG.    EKG 12/11/2015: Sinus rhythm at a rate of 66 bpm, normal axis, normal intervals, nonspecific T-wave abnormality in leads Duncan and  aVF. No significant change from EKG on 11/18/2015.  Assessment     ICD-10-CM   1. Primary hypertension  I10 lisinopril (ZESTRIL) 40 MG tablet    CMP14+EGFR  2. Hypercholesteremia  E78.00 Lipid Panel With LDL/HDL Ratio  3. Microvascular angina (HCC)  I20.8 carvedilol (COREG) 6.25 MG tablet     Medications Discontinued During This Encounter  Medication Reason  . metoprolol tartrate (LOPRESSOR) 25 MG tablet Change in therapy    Meds ordered this encounter  Medications  . lisinopril (ZESTRIL) 40 MG tablet    Sig: Take 1 tablet (40 mg total) by mouth every evening.    Dispense:  90 tablet    Refill:  3  . carvedilol (COREG) 6.25 MG tablet    Sig: Take 1 tablet (6.25 mg total) by mouth 2 (two) times daily with a meal.    Dispense:  60 tablet    Refill:  2    Discontinue Metoprolol   Orders Placed This Encounter  Procedures  . Lipid Panel With LDL/HDL Ratio  . CMP14+EGFR   Recommendations:   Derrick Duncan is a 55 y.o. Caucasian male patient with hypertension, hyperlipidemia, type 2 diabetes mellitus, history of premature coronary disease in his father who had coronary artery disease in his early 73s previously seen by me in 2017 when he presented with chest discomfort and had a negative nuclear stress test.  He underwent an angiography on 09/18/2020 and was found to have moderate coronary calcification diffusely in the right and also in the left coronaries with mild ectasia in the RCA.  There is also mild luminal irregularity.  Diagnosed with microvascular angina and moderate coronary disease he now presents for follow-up.  He has not had any recurrence of chest pain.  I personally reviewed the angiograms with the patient and his wife.  They were impressed by the coronary calcification and also diffuse luminal irregularity noted to the coronary arteries for his age, which  is just 55 years of age.  He is now tolerating atorvastatin 40 mg which was switched over from simvastatin 20  mg.  He will need repeat lipid profile testing.  Since being on metoprolol states that he has gained some weight, will discontinue this and in view of moderate coronary disease and microvascular angina and endothelial dysfunction, will start him on carvedilol which is also a vasodilatory beta-blocker.  His blood pressure has been uncontrolled since discontinuing lisinopril, will restart lisinopril. Check CMP along with lipid panel.  I would like to see him back in 2 months for follow-up. This was a 40 minute office visit encounter. His wife who is an Therapist, sports present and all questions answered.   Adrian Prows, MD, [facility] 10/15/2020, 9:23 PM Office: [phone] Pager: [phone]     Adrian Prows, MD, [facility] 10/15/2020, 9:23 PM Office: [phone]

## 2020-10-20 NOTE — Telephone Encounter (Signed)
Error

## 2020-12-12 ENCOUNTER — Other Ambulatory Visit: Payer: Self-pay | Admitting: Cardiology

## 2020-12-12 DIAGNOSIS — E7800 Pure hypercholesterolemia, unspecified: Secondary | ICD-10-CM

## 2020-12-15 ENCOUNTER — Ambulatory Visit: Payer: BC Managed Care – PPO | Admitting: Cardiology

## 2020-12-26 ENCOUNTER — Other Ambulatory Visit: Payer: Self-pay | Admitting: Cardiology

## 2020-12-26 DIAGNOSIS — I208 Other forms of angina pectoris: Secondary | ICD-10-CM

## 2020-12-27 ENCOUNTER — Other Ambulatory Visit: Payer: Self-pay | Admitting: Cardiology

## 2021-03-30 ENCOUNTER — Other Ambulatory Visit: Payer: Self-pay | Admitting: Cardiology

## 2021-03-30 DIAGNOSIS — I208 Other forms of angina pectoris: Secondary | ICD-10-CM

## 2021-03-31 ENCOUNTER — Other Ambulatory Visit: Payer: Self-pay | Admitting: Cardiology

## 2021-03-31 DIAGNOSIS — E7800 Pure hypercholesterolemia, unspecified: Secondary | ICD-10-CM

## 2021-05-15 ENCOUNTER — Other Ambulatory Visit: Payer: Self-pay | Admitting: Cardiology

## 2021-05-15 DIAGNOSIS — E7800 Pure hypercholesterolemia, unspecified: Secondary | ICD-10-CM

## 2021-05-15 DIAGNOSIS — I208 Other forms of angina pectoris: Secondary | ICD-10-CM

## 2021-06-30 LAB — HEMOGLOBIN A1C: Hemoglobin A1C: 14

## 2021-07-01 LAB — COMPREHENSIVE METABOLIC PANEL: GFR calc non Af Amer: 106

## 2021-07-01 LAB — LIPID PANEL
LDL Cholesterol: 80
Triglycerides: 314 — AB (ref 40–160)

## 2021-07-01 LAB — BASIC METABOLIC PANEL
BUN: 10 (ref 4–21)
Creatinine: 0.8 (ref 0.6–1.3)
Glucose: 267

## 2021-07-01 LAB — MICROALBUMIN / CREATININE URINE RATIO: Microalb Creat Ratio: 10

## 2021-07-06 ENCOUNTER — Encounter: Payer: Self-pay | Admitting: Cardiology

## 2021-07-06 NOTE — Telephone Encounter (Signed)
From patient.

## 2021-07-08 ENCOUNTER — Ambulatory Visit (INDEPENDENT_AMBULATORY_CARE_PROVIDER_SITE_OTHER): Payer: BC Managed Care – PPO | Admitting: Nurse Practitioner

## 2021-07-08 ENCOUNTER — Encounter: Payer: Self-pay | Admitting: Nurse Practitioner

## 2021-07-08 VITALS — BP 127/78 | HR 73 | Ht 69.0 in | Wt 222.6 lb

## 2021-07-08 DIAGNOSIS — E1165 Type 2 diabetes mellitus with hyperglycemia: Secondary | ICD-10-CM | POA: Diagnosis not present

## 2021-07-08 DIAGNOSIS — E782 Mixed hyperlipidemia: Secondary | ICD-10-CM | POA: Diagnosis not present

## 2021-07-08 DIAGNOSIS — I10 Essential (primary) hypertension: Secondary | ICD-10-CM

## 2021-07-08 NOTE — Patient Instructions (Signed)

## 2021-07-08 NOTE — Progress Notes (Signed)
Endocrinology Consult Note       07/08/2021, 4:06 PM   Subjective:    Patient ID: Derrick Duncan, male    DOB: 11-25-1965.  Derrick Duncan is being seen in consultation for management of currently uncontrolled symptomatic diabetes requested by  Shawnie Dapper, PA-C.   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 05/05/2017   Diabetes mellitus without complication [facility])    Hypercholesteremia    Hypertension    [facility] spotted fever    Weakness of both legs 11/2016   right arm also    Past Surgical History:  Procedure Laterality Date   COLONOSCOPY N/A 07/04/2019   Procedure: COLONOSCOPY;  Surgeon: Corbin Ade, MD;  Location: [facility] ENDO SUITE;  Service: Endoscopy;  Laterality: N/A;  2:00   CRANIOTOMY     subdural hematoma    LEFT HEART CATH AND CORONARY ANGIOGRAPHY N/A 09/18/2020   Procedure: LEFT HEART CATH AND CORONARY ANGIOGRAPHY;  Surgeon: Yates Decamp, MD;  Location: [facility] INVASIVE CV LAB;  Service: Cardiovascular;  Laterality: N/A;   TONSILLECTOMY      Social History   Socioeconomic History   Marital status: Married    Spouse name: Not on file   Number of children: 4   Years of education: Mastersx3   Highest education level: Not on file  Occupational History   Not on file  Tobacco Use   Smoking status: Never   Smokeless tobacco: Former    Types: Associate Professor Use: Never used  Substance and Sexual Activity   Alcohol use: Not Currently   Drug use: No   Sexual activity: Not on file  Other Topics Concern   Not on file  Social History Narrative   Lives with wife   Left handed    Caffeine use: Drinks coffee daily   Diet soft drinks   Social Determinants of Health   Financial Resource Strain: Not on file  Food Insecurity: Not on file  Transportation Needs: Not on file  Physical Activity: Not on file   Stress: Not on file  Social Connections: Not on file    Family History  Problem Relation Age of Onset   Hypertension Father    Diabetes Father    Hyperlipidemia Father    High blood pressure Sister    Stroke Sister    Kidney disease Sister     Outpatient Encounter Medications as of 07/08/2021  Medication Sig   aspirin 81 MG tablet Take 81 mg by mouth daily.   atorvastatin (LIPITOR) 40 MG tablet TAKE 1 TABLET BY MOUTH EVERY DAY   carvedilol (COREG) 6.25 MG tablet TAKE 1 TABLET BY MOUTH 2 TIMES DAILY WITH A MEAL   cetirizine-pseudoephedrine (ZYRTEC-D) 5-120 MG tablet Take 1 tablet by mouth daily as needed for allergies.   ibuprofen (ADVIL) 200 MG tablet Take 800 mg by mouth every 6 (six) hours as needed for headache or moderate pain.   lisinopril (ZESTRIL) 40 MG tablet Take 1 tablet (40 mg total) by mouth every evening.   magnesium oxide (MAG-OX) 400 MG tablet Take 400 mg by mouth daily.  metFORMIN (GLUCOPHAGE) 1000 MG tablet Take 0.5 tablets (500 mg total) by mouth 2 (two) times daily with a meal.   sildenafil (REVATIO) 20 MG tablet SMARTSIG:1-5 Tablet(s) By Mouth PRN   valACYclovir (VALTREX) 500 MG tablet Take 500 mg by mouth daily as needed (outbreaks).   [DISCONTINUED] empagliflozin (JARDIANCE) 25 MG TABS tablet Take 25 mg by mouth daily.   nitroGLYCERIN (NITROSTAT) 0.4 MG SL tablet Place 1 tablet (0.4 mg total) under the tongue every 5 (five) minutes as needed for up to 25 days for chest pain.   TRULICITY 0.75 MG/0.5ML SOPN Inject into the skin. (Patient not taking: Reported on 07/08/2021)   [DISCONTINUED] metoprolol tartrate (LOPRESSOR) 25 MG tablet TAKE 1 TABLET BY MOUTH TWICE A DAY (Patient not taking: Reported on 07/08/2021)   [DISCONTINUED] sildenafil (VIAGRA) 25 MG tablet Take 50 mg by mouth daily as needed for erectile dysfunction. (Patient not taking: Reported on 07/08/2021)   No facility-administered encounter medications on file as of 07/08/2021.    ALLERGIES: No  Known Allergies  VACCINATION STATUS: Immunization History  Administered Date(s) Administered   PFIZER(Purple Top)SARS-COV-2 Vaccination 10/13/2019, 11/05/2019   Tdap 03/04/2012    Diabetes He presents for his initial diabetic visit. He has type 2 diabetes mellitus. His disease course has been fluctuating. There are no hypoglycemic associated symptoms. Associated symptoms include blurred vision, fatigue, polydipsia, polyuria and weight loss. There are no hypoglycemic complications. Symptoms are stable. Risk factors for coronary artery disease include diabetes mellitus, dyslipidemia, family history, hypertension and male sex. Current diabetic treatment includes oral agent (dual therapy) (Metformin and Jardiance). He is compliant with treatment most of the time. His weight is decreasing steadily. He is following a generally unhealthy diet. When asked about meal planning, he reported none. He has not had a previous visit with a dietitian. He participates in exercise intermittently. His home blood glucose trend is decreasing steadily. (He presents today for his consultation, accompanied by his wife, with no meter or logs to review.  His most recent A1c was > 14% on 11/15.  He now monitors glucose once daily (recently started after his last check up).  He admits he drinks diet soda, coffee with creamer in addition to his water intake and eats 3 meals with snacks (he has also been working on his diet since his visit with his PCP too).  He does stay active but he does not engage in routine exercise outside of ADLs.  He is due for an eye exam, sees them next week.  His PCP recently increased his Metformin to 1000 mg po twice daily and gave him samples of Jardiance 25 mg po daily as well.  ) An ACE inhibitor/angiotensin II receptor blocker is being taken. He does not see a podiatrist.Eye exam is current.  Hypertension This is a chronic problem. The current episode started more than 1 year ago. The problem has been  resolved since onset. The problem is controlled. Associated symptoms include blurred vision. There are no associated agents to hypertension. Risk factors for coronary artery disease include diabetes mellitus, dyslipidemia, family history and male gender. Past treatments include ACE inhibitors and beta blockers. The current treatment provides moderate improvement. There are no compliance problems.   Hyperlipidemia This is a chronic problem. The current episode started more than 1 year ago. The problem is uncontrolled. Recent lipid tests were reviewed and are variable. Exacerbating diseases include diabetes. Factors aggravating his hyperlipidemia include beta blockers. Current antihyperlipidemic treatment includes statins. The current treatment provides mild improvement  of lipids. Compliance problems include adherence to diet and adherence to exercise.  Risk factors for coronary artery disease include diabetes mellitus, dyslipidemia, family history, male sex and hypertension.    Review of systems  Constitutional: + steadily decreasing body weight, current Body mass index is 32.87 kg/m., + fatigue, no subjective hyperthermia, no subjective hypothermia Eyes: + blurry vision, no xerophthalmia ENT: no sore throat, no nodules palpated in throat, no dysphagia/odynophagia, no hoarseness Cardiovascular: no chest pain, no shortness of breath, no palpitations, no leg swelling Respiratory: no cough, no shortness of breath Gastrointestinal: no nausea/vomiting/diarrhea Musculoskeletal: no muscle/joint aches Skin: no rashes, no hyperemia Neurological: no tremors, no numbness, no tingling, no dizziness Psychiatric: no depression, no anxiety  Objective:     BP 127/78   Pulse 73   Ht 5\' 9"  (1.753 m)   Wt 222 lb 9.6 oz (101 kg)   BMI 32.87 kg/m   Wt Readings from Last 3 Encounters:  07/08/21 222 lb 9.6 oz (101 kg)  10/14/20 230 lb (104.3 kg)  09/18/20 227 lb (103 kg)     BP Readings from Last 3  Encounters:  07/08/21 127/78  10/14/20 (!) 152/96  09/18/20 105/70     Physical Exam- Limited  Constitutional:  Body mass index is 32.87 kg/m. , not in acute distress, normal state of mind Eyes:  EOMI, no exophthalmos Neck: Supple Cardiovascular: RRR, no murmurs, rubs, or gallops, no edema Respiratory: Adequate breathing efforts, no crackles, rales, rhonchi, or wheezing Musculoskeletal: no gross deformities, strength intact in all four extremities, no gross restriction of joint movements Skin:  no rashes, no hyperemia Neurological: no tremor with outstretched hands    CMP ( most recent) CMP     Component Value Date/Time   NA 138 09/16/2020 1102   K 4.6 09/16/2020 1102   CL 99 09/16/2020 1102   CO2 24 09/16/2020 1102   GLUCOSE 212 (H) 09/16/2020 1102   GLUCOSE 107 (H) 11/16/2016 0709   BUN 10 07/01/2021 0000   CREATININE 0.8 07/01/2021 0000   CREATININE 0.86 09/16/2020 1102   CALCIUM 10.4 (H) 09/16/2020 1102   PROT 7.4 09/16/2020 1102   ALBUMIN 5.0 (H) 09/16/2020 1102   AST 14 09/16/2020 1102   ALT 21 09/16/2020 1102   ALKPHOS 104 09/16/2020 1102   BILITOT 1.5 (H) 09/16/2020 1102   GFRNONAA 106 07/01/2021 0000   GFRAA 114 09/16/2020 1102     Diabetic Labs (most recent): Lab Results  Component Value Date   HGBA1C 14 06/30/2021     Lipid Panel ( most recent) Lipid Panel     Component Value Date/Time   CHOL 192 09/16/2020 1102   TRIG 314 (A) 07/01/2021 0000   HDL 47 09/16/2020 1102   LDLCALC 80 07/01/2021 0000   LDLCALC 120 (H) 09/16/2020 1102   LABVLDL 25 09/16/2020 1102      Lab Results  Component Value Date   TSH 0.638 09/16/2020   TSH 0.621 11/15/2016           Assessment & Plan:   1) Type 2 diabetes mellitus with hyperglycemia, without long-term current use of insulin (HCC)  He presents today for his consultation, accompanied by his wife, with no meter or logs to review.  His most recent A1c was > 14% on 11/15.  He now monitors glucose  once daily (recently started after his last check up).  He admits he drinks diet soda, coffee with creamer in addition to his water intake and eats 3 meals with  snacks (he has also been working on his diet since his visit with his PCP too).  He does stay active but he does not engage in routine exercise outside of ADLs.  He is due for an eye exam, sees them next week.  His PCP recently increased his Metformin to 1000 mg po twice daily and gave him samples of Jardiance 25 mg po daily as well.    Derrick Duncan has currently uncontrolled symptomatic type 2 DM since 55 years of age, with most recent A1c of >14 %.   -Recent labs reviewed.  - I had a long discussion with him about the progressive nature of diabetes and the pathology behind its complications. -his diabetes is not currently complicated but he remains at a high risk for more acute and chronic complications which include CAD, CVA, CKD, retinopathy, and neuropathy. These are all discussed in detail with him.  - I have counseled him on diet and weight management by adopting a carbohydrate restricted/protein rich diet. Patient is encouraged to switch to unprocessed or minimally processed complex starch and increased protein intake (animal or plant source), fruits, and vegetables. -  he is advised to stick to a routine mealtimes to eat 3 meals a day and avoid unnecessary snacks (to snack only to correct hypoglycemia).   - he acknowledges that there is a room for improvement in his food and drink choices. - Suggestion is made for him to avoid simple carbohydrates from his diet including Cakes, Sweet Desserts, Ice Cream, Soda (diet and regular), Sweet Tea, Candies, Chips, Cookies, Store Bought Juices, Alcohol in Excess of 1-2 drinks a day, Artificial Sweeteners, Coffee Creamer, and "Sugar-free" Products. This will help patient to have more stable blood glucose profile and potentially avoid unintended weight gain.  - I have approached him  with the following individualized plan to manage his diabetes and patient agrees:   -he is encouraged to start monitoring glucose 4 times daily, before meals and before bed, to log their readings on the clinic sheets provided, and bring them to review at follow up appointment in 2 weeks.  - Adjustment parameters are given to him for hypo and hyperglycemia in writing. - he is encouraged to call clinic for blood glucose levels less than 70 or above 300 mg /dl. - he is advised to continue Metformin 1000 mg po twice daily with meals, therapeutically suitable for patient .  - I did discontinue his Jardiance today and advised them to proceed with using the Trulicity 0.75 mg SQ weekly that they have picked up as well.  - Specific targets for  A1c; LDL, HDL, and Triglycerides were discussed with the patient.  2) Blood Pressure /Hypertension:  his blood pressure is controlled to target.   he is advised to continue his current medications including Coreg 6.25 mg po twice daily, and Lisinopril 40 mg p.o. daily with breakfast.  3) Lipids/Hyperlipidemia:    Review of his recent lipid panel from 07/01/21 showed controlled LDL at 80 and elevated triglycerides of 314 .  he is advised to continue Lipitor 40 mg daily at bedtime.  Side effects and precautions discussed with him.  He is encouraged to stay away from fried foods and butter.  4)  Weight/Diet:  his Body mass index is 32.87 kg/m.  -  clearly complicating his diabetes care.   he is a candidate for moderate weight loss. I discussed with him the fact that loss of 5 - 10% of his  current  body weight will have the most impact on his diabetes management.  Exercise, and detailed carbohydrates information provided  -  detailed on discharge instructions.  5) Chronic Care/Health Maintenance: -he is on ACEI/ARB and Statin medications and is encouraged to initiate and continue to follow up with Ophthalmology, Dentist, Podiatrist at least yearly or according to  recommendations, and advised to stay away from smoking. I have recommended yearly flu vaccine and pneumonia vaccine at least every 5 years; moderate intensity exercise for up to 150 minutes weekly; and sleep for at least 7 hours a day.  - he is advised to maintain close follow up with Shawnie Dapper, PA-C for primary care needs, as well as his other providers for optimal and coordinated care.   - Time spent in this patient care: 60 min, of which > 50% was spent in counseling him about his diabetes and the rest reviewing his blood glucose logs, discussing his hypoglycemia and hyperglycemia episodes, reviewing his current and previous labs/studies (including abstraction from other facilities) and medications doses and developing a long term treatment plan based on the latest standards of care/guidelines; and documenting his care.    Please refer to Patient Instructions for Blood Glucose Monitoring and Insulin/Medications Dosing Guide" in media tab for additional information. Please also refer to "Patient Self Inventory" in the Media tab for reviewed elements of pertinent patient history.  Derrick Duncan participated in the discussions, expressed understanding, and voiced agreement with the above plans.  All questions were answered to his satisfaction. he is encouraged to contact clinic should he have any questions or concerns prior to his return visit.     Follow up plan: - Return in about 2 weeks (around 07/22/2021) for Diabetes F/U, Bring meter and logs.    Ronny Bacon, [facility] [address] [place], [place] [address] Phone: [phone] Fax: [phone]  07/08/2021, 4:06 PM

## 2021-07-22 ENCOUNTER — Other Ambulatory Visit: Payer: Self-pay

## 2021-07-22 ENCOUNTER — Ambulatory Visit (INDEPENDENT_AMBULATORY_CARE_PROVIDER_SITE_OTHER): Payer: BC Managed Care – PPO | Admitting: Nurse Practitioner

## 2021-07-22 ENCOUNTER — Encounter: Payer: Self-pay | Admitting: Nurse Practitioner

## 2021-07-22 VITALS — BP 130/86 | HR 54 | Ht 69.0 in | Wt 227.8 lb

## 2021-07-22 DIAGNOSIS — E782 Mixed hyperlipidemia: Secondary | ICD-10-CM

## 2021-07-22 DIAGNOSIS — E1165 Type 2 diabetes mellitus with hyperglycemia: Secondary | ICD-10-CM

## 2021-07-22 DIAGNOSIS — I10 Essential (primary) hypertension: Secondary | ICD-10-CM | POA: Diagnosis not present

## 2021-07-22 MED ORDER — TRULICITY 0.75 MG/0.5ML ~~LOC~~ SOAJ: 0.7500 mg | SUBCUTANEOUS | 3 refills | Status: DC

## 2021-07-22 NOTE — Patient Instructions (Signed)

## 2021-07-22 NOTE — Progress Notes (Signed)
Endocrinology Follow Up Note       07/22/2021, 4:40 PM   Subjective:    Patient ID: Derrick Duncan, male    DOB: 11/17/1965.  Derrick Duncan is being seen in follow up after being seen in consultation for management of currently uncontrolled symptomatic diabetes requested by  Shawnie Dapper, PA-C.   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 05/05/2017   Diabetes mellitus without complication [facility])    Hypercholesteremia    Hypertension    [facility] spotted fever    Weakness of both legs 11/2016   right arm also    Past Surgical History:  Procedure Laterality Date   COLONOSCOPY N/A 07/04/2019   Procedure: COLONOSCOPY;  Surgeon: Corbin Ade, MD;  Location: [facility];  Service: Endoscopy;  Laterality: N/A;  2:00   CRANIOTOMY     subdural hematoma    LEFT HEART CATH AND CORONARY ANGIOGRAPHY N/A 09/18/2020   Procedure: LEFT HEART CATH AND CORONARY ANGIOGRAPHY;  Surgeon: Yates Decamp, MD;  Location: [facility];  Service: Cardiovascular;  Laterality: N/A;   TONSILLECTOMY      Social History   Socioeconomic History   Marital status: Married    Spouse name: Not on file   Number of children: 4   Years of education: Mastersx3   Highest education level: Not on file  Occupational History   Not on file  Tobacco Use   Smoking status: Never   Smokeless tobacco: Former    Types: Associate Professor Use: Never used  Substance and Sexual Activity   Alcohol use: Not Currently   Drug use: No   Sexual activity: Not on file  Other Topics Concern   Not on file  Social History Narrative   Lives with wife   Left handed    Caffeine use: Drinks coffee daily   Diet soft drinks   Social Determinants of Health   Financial Resource Strain: Not on file  Food Insecurity: Not on file  Transportation Needs: Not on file   Physical Activity: Not on file  Stress: Not on file  Social Connections: Not on file    Family History  Problem Relation Age of Onset   Hypertension Father    Diabetes Father    Hyperlipidemia Father    High blood pressure Sister    Stroke Sister    Kidney disease Sister     Outpatient Encounter Medications as of 07/22/2021  Medication Sig   aspirin 81 MG tablet Take 81 mg by mouth daily.   atorvastatin (LIPITOR) 40 MG tablet TAKE 1 TABLET BY MOUTH EVERY DAY   carvedilol (COREG) 6.25 MG tablet TAKE 1 TABLET BY MOUTH 2 TIMES DAILY WITH A MEAL   cetirizine-pseudoephedrine (ZYRTEC-D) 5-120 MG tablet Take 1 tablet by mouth daily as needed for allergies.   fenofibrate (TRICOR) 48 MG tablet Take 48 mg by mouth daily.   ibuprofen (ADVIL) 200 MG tablet Take 800 mg by mouth every 6 (six) hours as needed for headache or moderate pain.   lisinopril (ZESTRIL) 40 MG tablet Take 1 tablet (40  mg total) by mouth every evening.   magnesium oxide (MAG-OX) 400 MG tablet Take 400 mg by mouth daily.   metFORMIN (GLUCOPHAGE) 1000 MG tablet Take 0.5 tablets (500 mg total) by mouth 2 (two) times daily with a meal.   sildenafil (REVATIO) 20 MG tablet SMARTSIG:1-5 Tablet(s) By Mouth PRN   valACYclovir (VALTREX) 500 MG tablet Take 500 mg by mouth daily as needed (outbreaks).   [DISCONTINUED] TRULICITY 0.75 MG/0.5ML SOPN Inject into the skin.   nitroGLYCERIN (NITROSTAT) 0.4 MG SL tablet Place 1 tablet (0.4 mg total) under the tongue every 5 (five) minutes as needed for up to 25 days for chest pain.   TRULICITY 0.75 MG/0.5ML SOPN Inject 0.75 mg into the skin once a week.   No facility-administered encounter medications on file as of 07/22/2021.    ALLERGIES: No Known Allergies  VACCINATION STATUS: Immunization History  Administered Date(s) Administered   PFIZER(Purple Top)SARS-COV-2 Vaccination 10/13/2019, 11/05/2019   Tdap 03/04/2012    Diabetes He presents for his follow-up diabetic visit. He has  type 2 diabetes mellitus. His disease course has been improving. There are no hypoglycemic associated symptoms. Associated symptoms include blurred vision and fatigue. Pertinent negatives for diabetes include no polydipsia, no polyuria and no weight loss. There are no hypoglycemic complications. Symptoms are improving. There are no diabetic complications. Risk factors for coronary artery disease include diabetes mellitus, dyslipidemia, family history, hypertension and male sex. Current diabetic treatment includes oral agent (dual therapy) (Metformin and Trulicity). He is compliant with treatment most of the time. His weight is stable. He is following a generally healthy diet. When asked about meal planning, he reported none. He has not had a previous visit with a dietitian. He participates in exercise intermittently. His home blood glucose trend is decreasing steadily. His breakfast blood glucose range is generally 110-130 mg/dl. His lunch blood glucose range is generally 70-90 mg/dl. His dinner blood glucose range is generally 70-90 mg/dl. His bedtime blood glucose range is generally 140-180 mg/dl. (He presents today, accompanied by his wife, with his meter and logs showing greatly improved glycemic profile with at target fasting and postprandial glucose readings.  He is not due for another A1c today.  He has worked hard on his diet and has cut out sodas altogether.  He does report having a headache for a few days after starting the Trulicity, but may also be related to his decreased caffeine intake as well.  He does report this is getting better.) An ACE inhibitor/angiotensin II receptor blocker is being taken. He does not see a podiatrist.Eye exam is current.  Hypertension This is a chronic problem. The current episode started more than 1 year ago. The problem has been resolved since onset. The problem is controlled. Associated symptoms include blurred vision. There are no associated agents to hypertension.  Risk factors for coronary artery disease include diabetes mellitus, dyslipidemia, family history and male gender. Past treatments include ACE inhibitors and beta blockers. The current treatment provides moderate improvement. There are no compliance problems.   Hyperlipidemia This is a chronic problem. The current episode started more than 1 year ago. The problem is uncontrolled. Recent lipid tests were reviewed and are variable. Exacerbating diseases include diabetes. Factors aggravating his hyperlipidemia include beta blockers. Current antihyperlipidemic treatment includes statins. The current treatment provides mild improvement of lipids. Compliance problems include adherence to diet and adherence to exercise.  Risk factors for coronary artery disease include diabetes mellitus, dyslipidemia, family history, male sex and hypertension.  Review of systems  Constitutional: + stable body weight, current Body mass index is 33.64 kg/m., + fatigue-improving, no subjective hyperthermia, no subjective hypothermia Eyes: + blurry vision-improving, no xerophthalmia ENT: no sore throat, no nodules palpated in throat, no dysphagia/odynophagia, no hoarseness Cardiovascular: no chest pain, no shortness of breath, no palpitations, no leg swelling Respiratory: no cough, no shortness of breath Gastrointestinal: no nausea/vomiting/diarrhea Musculoskeletal: no muscle/joint aches Skin: no rashes, no hyperemia Neurological: no tremors, no numbness, no tingling, no dizziness Psychiatric: no depression, no anxiety  Objective:     BP 130/86   Pulse (!) 54   Ht 5\' 9"  (1.753 m)   Wt 227 lb 12.8 oz (103.3 kg)   BMI 33.64 kg/m   Wt Readings from Last 3 Encounters:  07/22/21 227 lb 12.8 oz (103.3 kg)  07/08/21 222 lb 9.6 oz (101 kg)  10/14/20 230 lb (104.3 kg)     BP Readings from Last 3 Encounters:  07/22/21 130/86  07/08/21 127/78  10/14/20 (!) 152/96      Physical Exam- Limited  Constitutional:   Body mass index is 33.64 kg/m. , not in acute distress, normal state of mind Eyes:  EOMI, no exophthalmos Neck: Supple Cardiovascular: RRR, no murmurs, rubs, or gallops, no edema Respiratory: Adequate breathing efforts, no crackles, rales, rhonchi, or wheezing Musculoskeletal: no gross deformities, strength intact in all four extremities, no gross restriction of joint movements Skin:  no rashes, no hyperemia Neurological: no tremor with outstretched hands    CMP ( most recent) CMP     Component Value Date/Time   NA 138 09/16/2020 1102   K 4.6 09/16/2020 1102   CL 99 09/16/2020 1102   CO2 24 09/16/2020 1102   GLUCOSE 212 (H) 09/16/2020 1102   GLUCOSE 107 (H) 11/16/2016 0709   BUN 10 07/01/2021 0000   CREATININE 0.8 07/01/2021 0000   CREATININE 0.86 09/16/2020 1102   CALCIUM 10.4 (H) 09/16/2020 1102   PROT 7.4 09/16/2020 1102   ALBUMIN 5.0 (H) 09/16/2020 1102   AST 14 09/16/2020 1102   ALT 21 09/16/2020 1102   ALKPHOS 104 09/16/2020 1102   BILITOT 1.5 (H) 09/16/2020 1102   GFRNONAA 106 07/01/2021 0000   GFRAA 114 09/16/2020 1102     Diabetic Labs (most recent): Lab Results  Component Value Date   HGBA1C 14 06/30/2021     Lipid Panel ( most recent) Lipid Panel     Component Value Date/Time   CHOL 192 09/16/2020 1102   TRIG 314 (A) 07/01/2021 0000   HDL 47 09/16/2020 1102   LDLCALC 80 07/01/2021 0000   LDLCALC 120 (H) 09/16/2020 1102   LABVLDL 25 09/16/2020 1102      Lab Results  Component Value Date   TSH 0.638 09/16/2020   TSH 0.621 11/15/2016           Assessment & Plan:   1) Type 2 diabetes mellitus with hyperglycemia, without long-term current use of insulin (HCC)  He presents today, accompanied by his wife, with his meter and logs showing greatly improved glycemic profile with at target fasting and postprandial glucose readings.  He is not due for another A1c today.  He has worked hard on his diet and has cut out sodas altogether.  He does  report having a headache for a few days after starting the Trulicity, but may also be related to his decreased caffeine intake as well.  He does report this is getting better.  01/15/2017 Duncan has currently  uncontrolled symptomatic type 2 DM since 55 years of age, with most recent A1c of >14 %.   -Recent labs reviewed.  - I had a long discussion with him about the progressive nature of diabetes and the pathology behind its complications. -his diabetes is not currently complicated but he remains at a high risk for more acute and chronic complications which include CAD, CVA, CKD, retinopathy, and neuropathy. These are all discussed in detail with him.  - Nutritional counseling repeated at each appointment due to patients tendency to fall back in to old habits.  - The patient admits there is a room for improvement in their diet and drink choices. -  Suggestion is made for the patient to avoid simple carbohydrates from their diet including Cakes, Sweet Desserts / Pastries, Ice Cream, Soda (diet and regular), Sweet Tea, Candies, Chips, Cookies, Sweet Pastries, Store Bought Juices, Alcohol in Excess of 1-2 drinks a day, Artificial Sweeteners, Coffee Creamer, and "Sugar-free" Products. This will help patient to have stable blood glucose profile and potentially avoid unintended weight gain.   - I encouraged the patient to switch to unprocessed or minimally processed complex starch and increased protein intake (animal or plant source), fruits, and vegetables.   - Patient is advised to stick to a routine mealtimes to eat 3 meals a day and avoid unnecessary snacks (to snack only to correct hypoglycemia).  - I have approached him with the following individualized plan to manage his diabetes and patient agrees:   Given his improved glycemic profile, he is advised to continue his Metformin 1000 mg po twice daily with meals and Trulicity 0.75 mg SQ weekly.  He has a real chance of coming off all  diabetes medications if he can gain and maintain control of his glucose readings.  -He is encouraged to continue monitoring blood glucose at least once daily, before breakfast and to call the clinic if he has readings less than 70 or above 200 for 3 tests in a row.   -he is encouraged to start monitoring glucose 4 times daily, before meals and before bed, to log their readings on the clinic sheets provided, and bring them to review at follow up appointment in 2 weeks.  - Specific targets for  A1c; LDL, HDL, and Triglycerides were discussed with the patient.  2) Blood Pressure /Hypertension:  his blood pressure is controlled to target.   he is advised to continue his current medications including Coreg 6.25 mg po twice daily, and Lisinopril 40 mg p.o. daily with breakfast.  3) Lipids/Hyperlipidemia:    Review of his recent lipid panel from 07/01/21 showed controlled LDL at 80 and elevated triglycerides of 314 .  he is advised to continue Lipitor 40 mg daily at bedtime.  Side effects and precautions discussed with him.  He is encouraged to stay away from fried foods and butter.  4)  Weight/Diet:  his Body mass index is 33.64 kg/m.  -  clearly complicating his diabetes care.   he is a candidate for moderate weight loss. I discussed with him the fact that loss of 5 - 10% of his  current body weight will have the most impact on his diabetes management.  Exercise, and detailed carbohydrates information provided  -  detailed on discharge instructions.  5) Chronic Care/Health Maintenance: -he is on ACEI/ARB and Statin medications and is encouraged to initiate and continue to follow up with Ophthalmology, Dentist, Podiatrist at least yearly or according to recommendations, and advised to stay away  from smoking. I have recommended yearly flu vaccine and pneumonia vaccine at least every 5 years; moderate intensity exercise for up to 150 minutes weekly; and sleep for at least 7 hours a day.  - he is advised  to maintain close follow up with Shawnie Dapper, PA-C for primary care needs, as well as his other providers for optimal and coordinated care.     I spent 36 minutes in the care of the patient today including review of labs from CMP, Lipids, Thyroid Function, Hematology (current and previous including abstractions from other facilities); face-to-face time discussing  his blood glucose readings/logs, discussing hypoglycemia and hyperglycemia episodes and symptoms, medications doses, his options of short and long term treatment based on the latest standards of care / guidelines;  discussion about incorporating lifestyle medicine;  and documenting the encounter.    Please refer to Patient Instructions for Blood Glucose Monitoring and Insulin/Medications Dosing Guide"  in media tab for additional information. Please  also refer to " Patient Self Inventory" in the Media  tab for reviewed elements of pertinent patient history.  Derrick Duncan participated in the discussions, expressed understanding, and voiced agreement with the above plans.  All questions were answered to his satisfaction. he is encouraged to contact clinic should he have any questions or concerns prior to his return visit.     Follow up plan: - Return in about 3 months (around 10/20/2021) for Bring meter and logs, Diabetes F/U with A1c in office, No previsit labs.    Ronny Bacon, [facility] [address] [place], [place] [address] Phone: [phone] Fax: [phone]  07/22/2021, 4:40 PM

## 2021-09-26 ENCOUNTER — Other Ambulatory Visit: Payer: Self-pay | Admitting: Cardiology

## 2021-09-26 DIAGNOSIS — I10 Essential (primary) hypertension: Secondary | ICD-10-CM

## 2021-10-01 ENCOUNTER — Other Ambulatory Visit: Payer: Self-pay | Admitting: Cardiology

## 2021-10-01 DIAGNOSIS — I208 Other forms of angina pectoris: Secondary | ICD-10-CM

## 2021-10-02 ENCOUNTER — Other Ambulatory Visit: Payer: Self-pay | Admitting: Cardiology

## 2021-10-02 DIAGNOSIS — E7800 Pure hypercholesterolemia, unspecified: Secondary | ICD-10-CM

## 2021-10-19 ENCOUNTER — Ambulatory Visit
Admission: EM | Admit: 2021-10-19 | Discharge: 2021-10-19 | Disposition: A | Discharge status: Home / Self Care | Payer: BC Managed Care – PPO | Attending: Family Medicine | Admitting: Family Medicine

## 2021-10-19 ENCOUNTER — Other Ambulatory Visit: Payer: Self-pay

## 2021-10-19 DIAGNOSIS — H00014 Hordeolum externum left upper eyelid: Secondary | ICD-10-CM | POA: Diagnosis not present

## 2021-10-19 DIAGNOSIS — H1032 Unspecified acute conjunctivitis, left eye: Secondary | ICD-10-CM

## 2021-10-19 MED ORDER — ERYTHROMYCIN 5 MG/GM OP OINT: TOPICAL_OINTMENT | OPHTHALMIC | 0 refills | Status: DC

## 2021-10-19 NOTE — ED Provider Notes (Signed)
?[facility] ? ? ? ?CSN: [id number] ?Arrival date & time: 10/19/21  1757 ? ? ?  ? ?History   ?Chief Complaint ?Chief Complaint  ?Patient presents with  ? Conjunctivitis  ? ? ?HPI ?Derrick Duncan is a 56 y.o. male.  ? ?Presenting today with left eye irritation, redness, photophobia, and drainage since yesterday.  Denies any injury to the eye, fever, chills, visual change, headache, congestion, cough, sick contacts.  Has been trying some old eyedrops that he had at home and warm compresses with no relief. ? ? ?Past Medical History:  ?Diagnosis Date  ? Cervical disc disorder at C6-C7 level with radiculopathy 05/05/2017  ? Diabetes mellitus without complication (HCC)   ? Hypercholesteremia   ? Hypertension   ? [facility] spotted fever   ? Weakness of both legs 11/2016  ? right arm also  ? ? ?Patient Active Problem List  ? Diagnosis Date Noted  ? 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 Cervical disc disorder at C6-C7 level with radiculopathy 05/05/2017  ? Cervical radiculopathy at C7   ? Fatigue   ? Fever 11/15/2016  ? Polyarticular arthritis   ? 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 Diabetes mellitus type 2, controlled (HCC) 11/14/2016  ? FUO (fever of unknown origin) 11/14/2016  ? Diarrhea 11/14/2016  ? Essential hypertension 11/14/2016  ? HLD (hyperlipidemia)   ? ? ?Past Surgical History:  ?Procedure Laterality Date  ? COLONOSCOPY N/A 07/04/2019  ? Procedure: COLONOSCOPY;  Surgeon: Corbin Ade, MD;  [facility];  Service: Endoscopy;  Laterality: N/A;  2:00  ? CRANIOTOMY    ? subdural hematoma   ? LEFT HEART CATH AND CORONARY ANGIOGRAPHY N/A 09/18/2020  ? Procedure: LEFT HEART CATH AND CORONARY ANGIOGRAPHY;  Surgeon: Yates Decamp, MD;  [facility]: [facility];  Service: Cardiovascular;  Laterality: N/A;  ? TONSILLECTOMY    ? ? ? ? ? ?Home Medications   ? ?Prior to Admission medications   ?Medication Sig Start Date End Date Taking? Authorizing Provider  ?erythromycin ophthalmic  ointment Place a 1/2 inch ribbon of ointment into the left lower eyelid BID prn. 10/19/21  Yes Particia Nearing, PA-C  ?aspirin 81 MG tablet Take 81 mg by mouth daily.    [provider]  ?atorvastatin (LIPITOR) 40 MG tablet TAKE 1 TABLET BY MOUTH EVERY DAY 10/02/21   Yates Decamp, MD  ?carvedilol (COREG) 6.25 MG tablet TAKE 1 TABLET BY MOUTH 2 TIMES DAILY WITH A MEAL 10/01/21   Yates Decamp, MD  ?cetirizine-pseudoephedrine (ZYRTEC-D) 5-120 MG tablet Take 1 tablet by mouth daily as needed for allergies.    [provider]  ?fenofibrate (TRICOR) 48 MG tablet Take 48 mg by mouth daily. 07/14/21   [provider]  ?ibuprofen (ADVIL) 200 MG tablet Take 800 mg by mouth every 6 (six) hours as needed for headache or moderate pain.    [provider]  ?lisinopril (ZESTRIL) 40 MG tablet TAKE 1 TABLET BY MOUTH EVERY DAY IN THE EVENING 09/29/21   Yates Decamp, MD  ?magnesium oxide (MAG-OX) 400 MG tablet Take 400 mg by mouth daily.    [provider]  ?metFORMIN (GLUCOPHAGE) 1000 MG tablet Take 0.5 tablets (500 mg total) by mouth 2 (two) times daily with a meal. 09/20/20   Yates Decamp, MD  ?nitroGLYCERIN (NITROSTAT) 0.4 MG SL tablet Place 1 tablet (0.4 mg total) under the tongue every 5 (five) minutes as needed for up to 25 days for chest pain.  09/16/20 10/11/20  Yates Decamp, MD  ?sildenafil (REVATIO) 20 MG tablet SMARTSIG:1-5 Tablet(s) By Mouth PRN 03/31/21   [provider]  ?TRULICITY 0.75 MG/0.5ML SOPN Inject 0.75 mg into the skin once a week. 07/22/21   Dani Gobble, NP  ?valACYclovir (VALTREX) 500 MG tablet Take 500 mg by mouth daily as needed (outbreaks).    [provider]  ? ? ?Family History ?Family History  ?Problem Relation Age of Onset  ? Hypertension Father   ? Diabetes Father   ? Hyperlipidemia Father   ? High blood pressure Sister   ? Stroke Sister   ? Kidney disease Sister   ? ? ?Social History ?Social History  ? ?Tobacco Use  ? Smoking status: Never  ?  Smokeless tobacco: Former  ?  Types: Chew  ?Vaping Use  ? Vaping Use: Never used  ?Substance Use Topics  ? Alcohol use: Not Currently  ? Drug use: No  ? ? ? ?Allergies   ?Patient has no known allergies. ? ? ?Review of Systems ?Review of Systems ?Per HPI ? ?Physical Exam ?Triage Vital Signs ?ED Triage Vitals  ?Enc Vitals Group  ?   BP 10/19/21 1844 (!) 146/90  ?   Pulse Rate 10/19/21 1844 76  ?   Resp 10/19/21 1844 18  ?   Temp 10/19/21 1844 98.3 ?F (36.8 ?C)  ?   Temp src --   ?   SpO2 10/19/21 1844 97 %  ?   Weight --   ?   Height --   ?   Head Circumference --   ?   Peak Flow --   ?   Pain Score 10/19/21 1842 1  ?   Pain Loc --   ?   Pain Edu? --   ?   Excl. in GC? --   ? ?No data found. ? ?Updated Vital Signs ?BP (!) 146/90   Pulse 76   Temp 98.3 ?F (36.8 ?C)   Resp 18   SpO2 97%  ? ?Visual Acuity ?Right Eye Distance:   ?Left Eye Distance:   ?Bilateral Distance:   ? ?Right Eye Near:   ?Left Eye Near:    ?Bilateral Near:    ? ?Physical Exam ?Vitals and nursing note reviewed.  ?Constitutional:   ?   Appearance: Normal appearance.  ?HENT:  ?   Head: Atraumatic.  ?   Mouth/Throat:  ?   Mouth: Mucous membranes are moist.  ?Eyes:  ?   Extraocular Movements: Extraocular movements intact.  ?   Pupils: Pupils are equal, round, and reactive to light.  ?   Comments: No areas of increased uptake on fluorescein stain of the left eye.  Conjunctiva diffusely erythematous, injected.  Very small stye present on upper left lash line   ?Cardiovascular:  ?   Rate and Rhythm: Normal rate and regular rhythm.  ?Pulmonary:  ?   Effort: Pulmonary effort is normal.  ?   Breath sounds: Normal breath sounds.  ?Musculoskeletal:     ?   General: Normal range of motion.  ?   Cervical back: Normal range of motion and neck supple.  ?Skin: ?   General: Skin is warm and dry.  ?Neurological:  ?   General: No focal deficit present.  ?   Mental Status: He is oriented to person, place, and time.  ?Psychiatric:     ?   Mood and Affect: Mood normal.      ?   Thought Content: Thought  content normal.     ?   Judgment: Judgment normal.  ? ? ? ?UC Treatments / Results  ?Labs ?(all labs ordered are listed, but only abnormal results are displayed) ?Labs Reviewed - No data to display ? ?EKG ? ? ?Radiology ?No results found. ? ?Procedures ?Procedures (including critical care time) ? ?Medications Ordered in UC ?Medications - No data to display ? ?Initial Impression / Assessment and Plan / UC Course  ?I have reviewed the triage vital signs and the nursing notes. ? ?Pertinent labs & imaging results that were available during my care of the patient were reviewed by me and considered in my medical decision making (see chart for details). ? ?  ? ?Treat with erythromycin ointment, warm compresses, over-the-counter pain relievers, rest.  Return for acutely worsening symptoms.  Visual acuity deferred today with shared decision making due to no change in vision ? ?Final Clinical Impressions(s) / UC Diagnoses  ? ?Final diagnoses:  ?Acute conjunctivitis of left eye, unspecified acute conjunctivitis type  ?Hordeolum externum of left upper eyelid  ? ?Discharge Instructions   ?None ?  ? ?ED Prescriptions   ? ? Medication Sig Dispense Auth. Provider  ? erythromycin ophthalmic ointment Place a 1/2 inch ribbon of ointment into the left lower eyelid BID prn. 3.5 g Particia Nearing, PA-C  ? ?  ? ?PDMP not reviewed this encounter. ?  ?Particia Nearing, PA-C ?10/19/21 1912 ? ?

## 2021-10-19 NOTE — ED Triage Notes (Signed)
Pt presents with left eye irritation and redness since yesterday  ?

## 2021-10-21 ENCOUNTER — Ambulatory Visit: Payer: BC Managed Care – PPO | Admitting: Nurse Practitioner

## 2021-10-28 ENCOUNTER — Ambulatory Visit: Payer: BC Managed Care – PPO | Admitting: Nurse Practitioner

## 2021-10-28 ENCOUNTER — Encounter: Payer: Self-pay | Admitting: Nurse Practitioner

## 2021-10-28 ENCOUNTER — Other Ambulatory Visit: Payer: Self-pay

## 2021-10-28 VITALS — BP 151/95 | HR 66 | Ht 69.0 in | Wt 226.4 lb

## 2021-10-28 DIAGNOSIS — E1165 Type 2 diabetes mellitus with hyperglycemia: Secondary | ICD-10-CM | POA: Diagnosis not present

## 2021-10-28 DIAGNOSIS — I10 Essential (primary) hypertension: Secondary | ICD-10-CM | POA: Diagnosis not present

## 2021-10-28 DIAGNOSIS — E782 Mixed hyperlipidemia: Secondary | ICD-10-CM

## 2021-10-28 LAB — POCT GLYCOSYLATED HEMOGLOBIN (HGB A1C): HbA1c, POC (controlled diabetic range): 6.1 % (ref 0.0–7.0)

## 2021-10-28 MED ORDER — TRULICITY 0.75 MG/0.5ML ~~LOC~~ SOAJ: 0.7500 mg | SUBCUTANEOUS | 3 refills | Status: DC

## 2021-10-28 NOTE — Patient Instructions (Signed)

## 2021-10-28 NOTE — Progress Notes (Signed)
? ?                                                    Endocrinology Follow Up Note  ?     10/28/2021, 2:02 PM ? ? ?Subjective:  ? ? Patient ID: Derrick Duncan, male    DOB: 01/04/1966.  ?Derrick Duncan is being seen in follow up after being seen in consultation for management of currently uncontrolled symptomatic diabetes requested by  Shawnie Dapper, PA-C. ? ? ?Past Medical History:  ?Diagnosis Date  ? Cervical disc disorder at C6-C7 level with radiculopathy 05/05/2017  ? Diabetes mellitus without complication (HCC)   ? Hypercholesteremia   ? Hypertension   ? [facility] spotted fever   ? Weakness of both legs 11/2016  ? right arm also  ? ? ?Past Surgical History:  ?Procedure Laterality Date  ? COLONOSCOPY N/A 07/04/2019  ? Procedure: COLONOSCOPY;  Surgeon: Corbin Ade, MD;  [facility]: [facility];  Service: Endoscopy;  Laterality: N/A;  2:00  ? CRANIOTOMY    ? subdural hematoma   ? LEFT HEART CATH AND CORONARY ANGIOGRAPHY N/A 09/18/2020  ? Procedure: LEFT HEART CATH AND CORONARY ANGIOGRAPHY;  Surgeon: Yates Decamp, MD;  Location: [facility];  Service: Cardiovascular;  Laterality: N/A;  ? TONSILLECTOMY    ? ? ?Social History  ? ?Socioeconomic History  ? Marital status: Married  ?  Spouse name: Not on file  ? Number of children: 4  ? Years of education: Mastersx3  ? Highest education level: Not on file  ?Occupational History  ? Not on file  ?Tobacco Use  ? Smoking status: Never  ? Smokeless tobacco: Former  ?  Types: Chew  ?Vaping Use  ? Vaping Use: Never used  ?Substance and Sexual Activity  ? Alcohol use: Not Currently  ? Drug use: No  ? Sexual activity: Not on file  ?Other Topics Concern  ? Not on file  ?Social History Narrative  ? Lives with wife  ? Left handed   ? Caffeine use: Drinks coffee daily  ? Diet soft drinks  ? ?Social Determinants of Health  ? ?Financial Resource Strain: Not on file  ?Food Insecurity: Not on file  ?Transportation Needs: Not on file   ?Physical Activity: Not on file  ?Stress: Not on file  ?Social Connections: Not on file  ? ? ?Family History  ?Problem Relation Age of Onset  ? Hypertension Father   ? Diabetes Father   ? Hyperlipidemia Father   ? High blood pressure Sister   ? Stroke Sister   ? Kidney disease Sister   ? ? ?Outpatient Encounter Medications as of 10/28/2021  ?Medication Sig  ? aspirin 81 MG tablet Take 81 mg by mouth daily.  ? atorvastatin (LIPITOR) 40 MG tablet TAKE 1 TABLET BY MOUTH EVERY DAY  ? carvedilol (COREG) 6.25 MG tablet TAKE 1 TABLET BY MOUTH 2 TIMES DAILY WITH A MEAL  ? Cephalexin 500 MG tablet Take 500 mg by mouth 3 (three) times daily.  ? cetirizine-pseudoephedrine (ZYRTEC-D) 5-120 MG tablet Take 1 tablet by mouth daily as needed for allergies.  ? erythromycin ophthalmic ointment Place a 1/2 inch ribbon of ointment into the left lower eyelid BID prn.  ? fenofibrate (TRICOR) 48 MG tablet Take 48 mg by mouth daily.  ?  ibuprofen (ADVIL) 200 MG tablet Take 800 mg by mouth every 6 (six) hours as needed for headache or moderate pain.  ? lisinopril (ZESTRIL) 40 MG tablet TAKE 1 TABLET BY MOUTH EVERY DAY IN THE EVENING  ? magnesium oxide (MAG-OX) 400 MG tablet Take 400 mg by mouth daily.  ? neomycin-polymyxin b-dexamethasone (MAXITROL) 3.5-10000-0.1 SUSP Place 1 drop into the left eye 4 (four) times daily.  ? ONETOUCH ULTRA test strip   ? sildenafil (REVATIO) 20 MG tablet SMARTSIG:1-5 Tablet(s) By Mouth PRN  ? valACYclovir (VALTREX) 500 MG tablet Take 500 mg by mouth daily as needed (outbreaks).  ? [DISCONTINUED] metFORMIN (GLUCOPHAGE) 1000 MG tablet Take 0.5 tablets (500 mg total) by mouth 2 (two) times daily with a meal.  ? [DISCONTINUED] TRULICITY 0.75 MG/0.5ML SOPN Inject 0.75 mg into the skin once a week.  ? nitroGLYCERIN (NITROSTAT) 0.4 MG SL tablet Place 1 tablet (0.4 mg total) under the tongue every 5 (five) minutes as needed for up to 25 days for chest pain.  ? TRULICITY 0.75 MG/0.5ML SOPN Inject 0.75 mg into the skin  once a week.  ? ?No facility-administered encounter medications on file as of 10/28/2021.  ? ? ?ALLERGIES: ?No Known Allergies ? ?VACCINATION STATUS: ?Immunization History  ?Administered Date(s) Administered  ? PFIZER(Purple Top)SARS-COV-2 Vaccination 10/13/2019, 11/05/2019  ? Tdap 03/04/2012  ? ? ?Diabetes ?He presents for his follow-up diabetic visit. He has type 2 diabetes mellitus. His disease course has been improving. There are no hypoglycemic associated symptoms. Associated symptoms include blurred vision and fatigue. Pertinent negatives for diabetes include no polydipsia, no polyuria and no weight loss. There are no hypoglycemic complications. Symptoms are improving. There are no diabetic complications. Risk factors for coronary artery disease include diabetes mellitus, dyslipidemia, family history, hypertension and male sex. Current diabetic treatment includes oral agent (monotherapy) (Metformin and Trulicity). He is compliant with treatment most of the time. His weight is stable. He is following a generally healthy diet. When asked about meal planning, he reported none. He has not had a previous visit with a dietitian. He participates in exercise intermittently. His home blood glucose trend is decreasing steadily. His breakfast blood glucose range is generally 70-90 mg/dl. His overall blood glucose range is 70-90 mg/dl. (He presents today with his meter and logs showing at goal glycemic profile.  His POCT A1c today is 6.1%, significantly improved from last visit of 14%.  He denies any hypoglycemia, has been working hard on his diet and exercise.  Analysis of his meter shows 7,14,30 day averages of 90.) An ACE inhibitor/angiotensin II receptor blocker is being taken. He does not see a podiatrist.Eye exam is current.  ?Hypertension ?This is a chronic problem. The current episode started more than 1 year ago. The problem has been waxing and waning since onset. The problem is uncontrolled. Associated symptoms  include blurred vision. There are no associated agents to hypertension. Risk factors for coronary artery disease include diabetes mellitus, dyslipidemia, family history and male gender. Past treatments include ACE inhibitors and beta blockers. The current treatment provides moderate improvement. There are no compliance problems.   ?Hyperlipidemia ?This is a chronic problem. The current episode started more than 1 year ago. The problem is uncontrolled. Recent lipid tests were reviewed and are variable. Exacerbating diseases include diabetes. Factors aggravating his hyperlipidemia include beta blockers. Current antihyperlipidemic treatment includes statins. The current treatment provides mild improvement of lipids. Compliance problems include adherence to diet and adherence to exercise.  Risk factors for coronary artery  disease include diabetes mellitus, dyslipidemia, family history, male sex and hypertension.  ? ? ?Review of systems ? ?Constitutional: + Minimally fluctuating body weight,  current Body mass index is 33.43 kg/m?. , no fatigue, no subjective hyperthermia, no subjective hypothermia ?Eyes: no blurry vision, no xerophthalmia ?ENT: no sore throat, no nodules palpated in throat, no dysphagia/odynophagia, no hoarseness ?Cardiovascular: no chest pain, no shortness of breath, no palpitations, no leg swelling ?Respiratory: no cough, no shortness of breath ?Gastrointestinal: no nausea/vomiting/diarrhea ?Musculoskeletal: no muscle/joint aches ?Skin: no rashes, no hyperemia ?Neurological: no tremors, no numbness, no tingling, no dizziness ?Psychiatric: no depression, no anxiety ? ?Objective:  ?  ? ?BP (!) 151/95   Pulse 66   Ht 5\' 9"  (1.753 m)   Wt 226 lb 6.4 oz (102.7 kg)   SpO2 99%   BMI 33.43 kg/m?   ?Wt Readings from Last 3 Encounters:  ?10/28/21 226 lb 6.4 oz (102.7 kg)  ?07/22/21 227 lb 12.8 oz (103.3 kg)  ?07/08/21 222 lb 9.6 oz (101 kg)  ?  ? ?BP Readings from Last 3 Encounters:  ?10/28/21 (!) 151/95   ?10/19/21 (!) 146/90  ?07/22/21 130/86  ?  ? ? ?Physical Exam- Limited ? ?Constitutional:  Body mass index is 33.43 kg/m?. , not in acute distress, normal state of mind ?Eyes:  EOMI, no exophthalmos ?Neck: Supple ?

## 2021-10-28 NOTE — Patient Instructions (Incomplete)

## 2021-11-28 ENCOUNTER — Other Ambulatory Visit: Payer: Self-pay | Admitting: Cardiology

## 2021-11-28 DIAGNOSIS — I208 Other forms of angina pectoris: Secondary | ICD-10-CM

## 2021-11-28 DIAGNOSIS — E7800 Pure hypercholesterolemia, unspecified: Secondary | ICD-10-CM

## 2022-01-09 ENCOUNTER — Other Ambulatory Visit: Payer: Self-pay | Admitting: Cardiology

## 2022-01-09 DIAGNOSIS — I208 Other forms of angina pectoris: Secondary | ICD-10-CM

## 2022-01-09 DIAGNOSIS — E7800 Pure hypercholesterolemia, unspecified: Secondary | ICD-10-CM

## 2022-03-22 ENCOUNTER — Ambulatory Visit: Payer: Self-pay

## 2022-03-22 ENCOUNTER — Ambulatory Visit
Admission: EM | Admit: 2022-03-22 | Discharge: 2022-03-22 | Disposition: A | Discharge status: Home / Self Care | Payer: BC Managed Care – PPO | Attending: Family Medicine | Admitting: Family Medicine

## 2022-03-22 DIAGNOSIS — R112 Nausea with vomiting, unspecified: Secondary | ICD-10-CM

## 2022-03-22 DIAGNOSIS — R509 Fever, unspecified: Secondary | ICD-10-CM | POA: Diagnosis not present

## 2022-03-22 DIAGNOSIS — M791 Myalgia, unspecified site: Secondary | ICD-10-CM | POA: Diagnosis not present

## 2022-03-22 MED ORDER — DOXYCYCLINE HYCLATE 100 MG PO CAPS: 100.0000 mg | ORAL_CAPSULE | Freq: Two times a day (BID) | ORAL | 0 refills | Status: DC

## 2022-03-22 MED ORDER — ONDANSETRON 4 MG PO TBDP: 4.0000 mg | ORAL_TABLET | Freq: Three times a day (TID) | ORAL | 0 refills | Status: AC | PRN

## 2022-03-22 MED ORDER — ONDANSETRON 4 MG PO TBDP
4.0000 mg | ORAL_TABLET | Freq: Once | ORAL | Status: AC
  Administered 2022-03-22: 4 mg via ORAL

## 2022-03-22 NOTE — ED Triage Notes (Signed)
Pt presents with c/o nausea and vomiting since Friday, reports neck and headache for past week. As well as low grade fever . Is concerned with tickborne illness . Covid is negative

## 2022-03-22 NOTE — ED Provider Notes (Signed)
[facility]    CSN: [id number] Arrival date & time: 03/22/22  7353      History   Chief Complaint Chief Complaint  Patient presents with   Fever   Headache   Nausea   Appointment    HPI Derrick Duncan III is a 56 y.o. male.   Patient presenting today with 4-day history of nausea, vomiting, headache, low-grade fever, fatigue, muscle aches worse around the neck and shoulders.  He denies cough, sore throat, chest pain, shortness of breath, dizziness, significant abdominal pain, bowel changes, rashes.  So far trying over-the-counter nausea medication, supportive measures with minimal relief.  History of [place] spotted fever that felt similar, no obvious tick bites recently but works outside a lot and states there are lots of ticks in his environment.  No known sick contacts recently.  Home COVID test was negative.   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   Diabetes mellitus type 2, controlled (HCC) 11/14/2016   FUO (fever of unknown origin) 11/14/2016   Diarrhea 11/14/2016   Essential hypertension 11/14/2016   HLD (hyperlipidemia)    Past Surgical History:  Procedure Laterality Date   COLONOSCOPY N/A 07/04/2019   Procedure: COLONOSCOPY;  Surgeon: Corbin Ade, MD;  Location: [facility];  Service: Endoscopy;  Laterality: N/A;  2:00   CRANIOTOMY     subdural hematoma    LEFT HEART CATH AND CORONARY ANGIOGRAPHY N/A 09/18/2020    Procedure: LEFT HEART CATH AND CORONARY ANGIOGRAPHY;  Surgeon: Yates Decamp, MD;  Location: [facility];  Service: Cardiovascular;  Laterality: N/A;   TONSILLECTOMY       Home Medications    Prior to Admission medications   Medication Sig Start Date End Date Taking? Authorizing Provider  doxycycline (VIBRAMYCIN) 100 MG capsule Take 1 capsule (100 mg total) by mouth 2 (two) times daily. 03/22/22  Yes Particia Nearing, PA-C  ondansetron (ZOFRAN-ODT) 4 MG disintegrating tablet Take 1 tablet (4 mg total) by mouth every 8 (eight) hours as needed for nausea or vomiting. 03/22/22  Yes Particia Nearing, PA-C  aspirin 81 MG tablet Take 81 mg by mouth daily.    [provider]  atorvastatin (LIPITOR) 40 MG tablet TAKE 1 TABLET BY MOUTH EVERY DAY 01/12/22   Yates Decamp, MD  carvedilol (COREG) 6.25 MG tablet TAKE 1 TABLET BY MOUTH 2 TIMES DAILY WITH A MEAL 01/12/22   Yates Decamp, MD  Cephalexin 500 MG tablet Take 500 mg by mouth 3 (three) times daily. 10/26/21   [provider]  cetirizine-pseudoephedrine (ZYRTEC-D) 5-120 MG tablet Take 1 tablet by mouth daily as needed for allergies.    [provider]  erythromycin ophthalmic ointment Place a 1/2 inch ribbon of ointment into the left lower eyelid BID prn. 10/19/21   Particia Nearing, PA-C  fenofibrate (TRICOR) 48 MG tablet Take 48 mg by mouth daily. 07/14/21  [provider]  ibuprofen (ADVIL) 200 MG tablet Take 800 mg by mouth every 6 (six) hours as needed for headache or moderate pain.    [provider]  lisinopril (ZESTRIL) 40 MG tablet TAKE 1 TABLET BY MOUTH EVERY DAY IN THE EVENING 09/29/21   Yates Decamp, MD  magnesium oxide (MAG-OX) 400 MG tablet Take 400 mg by mouth daily.    [provider]  neomycin-polymyxin b-dexamethasone (MAXITROL) 3.5-10000-0.1 SUSP Place 1 drop into the left eye 4 (four) times daily. 10/26/21   [provider]  nitroGLYCERIN (NITROSTAT) 0.4 MG SL  tablet Place 1 tablet (0.4 mg total) under the tongue every 5 (five) minutes as needed for up to 25 days for chest pain. 09/16/20 10/11/20  Yates Decamp, MD  [facility] ULTRA test strip  10/19/21   [provider]  sildenafil (REVATIO) 20 MG tablet SMARTSIG:1-5 Tablet(s) By Mouth PRN 03/31/21   [provider]  TRULICITY 0.75 MG/0.5ML SOPN Inject 0.75 mg into the skin once a week. 10/28/21   Dani Gobble, NP  valACYclovir (VALTREX) 500 MG tablet Take 500 mg by mouth daily as needed (outbreaks).    [provider]    Family History Family History  Problem Relation Age of Onset   Hypertension Father    Diabetes Father    Hyperlipidemia Father    High blood pressure Sister    Stroke Sister    Kidney disease Sister     Social History Social History   Tobacco Use   Smoking status: Never   Smokeless tobacco: Former    Types: Associate Professor Use: Never used  Substance Use Topics   Alcohol use: Not Currently   Drug use: No     Allergies   Patient has no known allergies.   Review of Systems Review of Systems Per HPI  Physical Exam Triage Vital Signs ED Triage Vitals  Enc Vitals Group     BP 03/22/22 0958 116/78     Pulse Rate 03/22/22 0958 (!) 104     Resp 03/22/22 0958 18     Temp 03/22/22 0958 98.9 F (37.2 C)     Temp src --      SpO2 03/22/22 0958 94 %     Weight --      Height --      Head Circumference --      Peak Flow --      Pain Score 03/22/22 0957 6     Pain Loc --      Pain Edu? --      Excl. in GC? --    No data found.  Updated Vital Signs BP 116/78   Pulse (!) 104   Temp 98.9 F (37.2 C)   Resp 18   SpO2 94%   Visual Acuity Right Eye Distance:   Left Eye Distance:   Bilateral Distance:    Right Eye Near:   Left Eye Near:    Bilateral Near:     Physical Exam Vitals and nursing note reviewed.  Constitutional:      Appearance: Normal appearance.  HENT:     Head: Atraumatic.     Nose: Nose normal.      Mouth/Throat:     Mouth: Mucous membranes are moist.     Pharynx: Oropharynx is clear. No posterior oropharyngeal erythema.  Eyes:     Extraocular Movements: Extraocular movements intact.     Conjunctiva/sclera: Conjunctivae normal.  Cardiovascular:  Rate and Rhythm: Normal rate and regular rhythm.     Heart sounds: Normal heart sounds.  Pulmonary:     Effort: Pulmonary effort is normal.     Breath sounds: Normal breath sounds.  Abdominal:     General: Bowel sounds are normal. There is no distension.     Palpations: Abdomen is soft.     Tenderness: There is no abdominal tenderness. There is no guarding.  Musculoskeletal:        General: Normal range of motion.     Cervical back: Normal range of motion and neck supple.  Skin:    General: Skin is warm and dry.     Findings: No rash.  Neurological:     General: No focal deficit present.     Mental Status: He is oriented to person, place, and time.     Motor: No weakness.     Gait: Gait normal.  Psychiatric:        Mood and Affect: Mood normal.        Thought Content: Thought content normal.        Judgment: Judgment normal.      UC Treatments / Results  Labs (all labs ordered are listed, but only abnormal results are displayed) Labs Reviewed  COVID-19, FLU A+B NAA  CBC WITH DIFFERENTIAL/PLATELET  COMPREHENSIVE METABOLIC PANEL  ROCKY MTN SPOTTED FVR ABS PNL(IGG+IGM)  LYME DISEASE SEROLOGY W/REFLEX    EKG   Radiology No results found.  Procedures Procedures (including critical care time)  Medications Ordered in UC Medications  ondansetron (ZOFRAN-ODT) disintegrating tablet 4 mg (4 mg Oral Given 03/22/22 1005)    Initial Impression / Assessment and Plan / UC Course  I have reviewed the triage vital signs and the nursing notes.  Pertinent labs & imaging results that were available during my care of the patient were reviewed by me and considered in my medical decision making (see chart for details).      Overall exam very reassuring, Zofran given in triage relieving his current nausea.  COVID and flu PCR pending as well as CBC, CMP, tick illnesses.  We will start doxycycline meantime given concern for tickborne illness and history of the same, discussed Zofran as needed, brat diet, fluids, over-the-counter supportive medications.  Strict return precautions given for any acutely worsening symptoms.  Final Clinical Impressions(s) / UC Diagnoses   Final diagnoses:  Fever, unspecified  Nausea and vomiting, unspecified vomiting type  Myalgia   Discharge Instructions   None    ED Prescriptions     Medication Sig Dispense Auth. Provider   ondansetron (ZOFRAN-ODT) 4 MG disintegrating tablet Take 1 tablet (4 mg total) by mouth every 8 (eight) hours as needed for nausea or vomiting. 20 tablet Particia Nearing, [place]   doxycycline (VIBRAMYCIN) 100 MG capsule Take 1 capsule (100 mg total) by mouth 2 (two) times daily. 14 capsule Particia Nearing, [place]      PDMP not reviewed this encounter.   Particia Nearing, [place] 03/22/22 1340

## 2022-03-23 LAB — ROCKY MTN SPOTTED FVR ABS PNL(IGG+IGM)
RMSF IgG: NEGATIVE
RMSF IgM: 0.41 index (ref 0.00–0.89)

## 2022-03-23 LAB — COVID-19, FLU A+B NAA
Influenza A, NAA: NOT DETECTED
Influenza B, NAA: NOT DETECTED
SARS-CoV-2, NAA: NOT DETECTED

## 2022-03-23 LAB — CBC WITH DIFFERENTIAL/PLATELET
Basophils Absolute: 0 10*3/uL (ref 0.0–0.2)
Basos: 0 %
EOS (ABSOLUTE): 0 10*3/uL (ref 0.0–0.4)
Eos: 1 %
Hematocrit: 45.4 % (ref 37.5–51.0)
Hemoglobin: 15.5 g/dL (ref 13.0–17.7)
Immature Grans (Abs): 0 10*3/uL (ref 0.0–0.1)
Immature Granulocytes: 0 %
Lymphocytes Absolute: 0.6 10*3/uL — ABNORMAL LOW (ref 0.7–3.1)
Lymphs: 11 %
MCH: 31.5 pg (ref 26.6–33.0)
MCHC: 34.1 g/dL (ref 31.5–35.7)
MCV: 92 fL (ref 79–97)
Monocytes Absolute: 0.6 10*3/uL (ref 0.1–0.9)
Monocytes: 12 %
Neutrophils Absolute: 3.8 10*3/uL (ref 1.4–7.0)
Neutrophils: 76 %
Platelets: 225 10*3/uL (ref 150–450)
RBC: 4.92 x10E6/uL (ref 4.14–5.80)
RDW: 13 % (ref 11.6–15.4)
WBC: 5 10*3/uL (ref 3.4–10.8)

## 2022-03-23 LAB — COMPREHENSIVE METABOLIC PANEL
ALT: 25 IU/L (ref 0–44)
AST: 26 IU/L (ref 0–40)
Albumin/Globulin Ratio: 1.9 (ref 1.2–2.2)
Albumin: 4.7 g/dL (ref 3.8–4.9)
Alkaline Phosphatase: 60 IU/L (ref 44–121)
BUN/Creatinine Ratio: 18 (ref 9–20)
BUN: 16 mg/dL (ref 6–24)
Bilirubin Total: 0.9 mg/dL (ref 0.0–1.2)
CO2: 13 mmol/L — ABNORMAL LOW (ref 20–29)
Calcium: 9.3 mg/dL (ref 8.7–10.2)
Chloride: 102 mmol/L (ref 96–106)
Creatinine, Ser: 0.87 mg/dL (ref 0.76–1.27)
Globulin, Total: 2.5 g/dL (ref 1.5–4.5)
Glucose: 123 mg/dL — ABNORMAL HIGH (ref 70–99)
Potassium: 3.8 mmol/L (ref 3.5–5.2)
Sodium: 136 mmol/L (ref 134–144)
Total Protein: 7.2 g/dL (ref 6.0–8.5)
eGFR: 102 mL/min/{1.73_m2} (ref >59)

## 2022-03-23 LAB — LYME DISEASE SEROLOGY W/REFLEX: Lyme Total Antibody EIA: NEGATIVE

## 2022-04-09 ENCOUNTER — Other Ambulatory Visit: Payer: Self-pay | Admitting: Cardiology

## 2022-04-09 DIAGNOSIS — I208 Other forms of angina pectoris: Secondary | ICD-10-CM

## 2022-04-09 DIAGNOSIS — E7800 Pure hypercholesterolemia, unspecified: Secondary | ICD-10-CM

## 2022-05-05 ENCOUNTER — Other Ambulatory Visit: Payer: Self-pay | Admitting: Cardiology

## 2022-05-05 DIAGNOSIS — E7800 Pure hypercholesterolemia, unspecified: Secondary | ICD-10-CM

## 2022-05-16 ENCOUNTER — Other Ambulatory Visit: Payer: Self-pay | Admitting: Cardiology

## 2022-05-16 DIAGNOSIS — I2089 Other forms of angina pectoris: Secondary | ICD-10-CM

## 2022-06-22 ENCOUNTER — Other Ambulatory Visit: Payer: Self-pay

## 2022-06-22 MED ORDER — INFLUENZA VAC SPLIT QUAD 0.5 ML IM SUSY
PREFILLED_SYRINGE | INTRAMUSCULAR | 0 refills | Status: AC
Start: 2022-06-22 — End: ?
  Filled 2022-06-22: qty 0.5, 1d supply, fill #0

## 2022-09-17 ENCOUNTER — Other Ambulatory Visit: Payer: Self-pay | Admitting: Cardiology

## 2022-09-17 DIAGNOSIS — I10 Essential (primary) hypertension: Secondary | ICD-10-CM

## 2022-10-18 ENCOUNTER — Other Ambulatory Visit: Payer: Self-pay

## 2022-12-28 ENCOUNTER — Other Ambulatory Visit: Payer: Self-pay | Admitting: Nurse Practitioner

## 2022-12-30 ENCOUNTER — Other Ambulatory Visit: Payer: Self-pay | Admitting: Cardiology

## 2022-12-30 DIAGNOSIS — I10 Essential (primary) hypertension: Secondary | ICD-10-CM

## 2023-01-24 ENCOUNTER — Ambulatory Visit: Payer: BC Managed Care – PPO | Admitting: Nurse Practitioner

## 2023-01-24 ENCOUNTER — Encounter: Payer: Self-pay | Admitting: Nurse Practitioner

## 2023-01-24 VITALS — BP 136/93 | HR 66 | Ht 72.0 in | Wt 239.0 lb

## 2023-01-24 DIAGNOSIS — I10 Essential (primary) hypertension: Secondary | ICD-10-CM | POA: Diagnosis not present

## 2023-01-24 DIAGNOSIS — Z7985 Long-term (current) use of injectable non-insulin antidiabetic drugs: Secondary | ICD-10-CM | POA: Diagnosis not present

## 2023-01-24 DIAGNOSIS — E1165 Type 2 diabetes mellitus with hyperglycemia: Secondary | ICD-10-CM | POA: Diagnosis not present

## 2023-01-24 DIAGNOSIS — E782 Mixed hyperlipidemia: Secondary | ICD-10-CM

## 2023-01-24 LAB — POCT GLYCOSYLATED HEMOGLOBIN (HGB A1C): Hemoglobin A1C: 9.6 % — AB (ref 4.0–5.6)

## 2023-01-24 MED ORDER — ACCU-CHEK GUIDE ME W/DEVICE KIT
PACK | 0 refills | Status: AC
Start: 2023-01-24 — End: ?

## 2023-01-24 NOTE — Progress Notes (Signed)
Endocrinology Follow Up Note       01/24/2023, 10:49 AM   Subjective:    Patient ID: Derrick Duncan, male    DOB: [birth year].  Derrick Duncan is being seen in follow up after being seen in consultation for management of currently uncontrolled symptomatic diabetes requested by  Assunta Found, MD.   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 05/05/2017   Diabetes mellitus without complication [facility])    Hypercholesteremia    Hypertension    [facility] spotted fever    Weakness of both legs 11/2016   right arm also    Past Surgical History:  Procedure Laterality Date   COLONOSCOPY N/A 07/04/2019   Procedure: COLONOSCOPY;  Surgeon: Corbin Ade, MD;  Location: [facility];  Service: Endoscopy;  Laterality: N/A;  2:00   CRANIOTOMY     subdural hematoma    LEFT HEART CATH AND CORONARY ANGIOGRAPHY N/A 09/18/2020   Procedure: LEFT HEART CATH AND CORONARY ANGIOGRAPHY;  Surgeon: Yates Decamp, MD;  Location: [facility] INVASIVE CV LAB;  Service: Cardiovascular;  Laterality: N/A;   TONSILLECTOMY      Social History   Socioeconomic History   Marital status: Married    Spouse name: Not on file   Number of children: 4   Years of education: Mastersx3   Highest education level: Not on file  Occupational History   Not on file  Tobacco Use   Smoking status: Never   Smokeless tobacco: Former    Types: Associate Professor Use: Never used  Substance and Sexual Activity   Alcohol use: Not Currently   Drug use: No   Sexual activity: Not on file  Other Topics Concern   Not on file  Social History Narrative   Lives with wife   Left handed    Caffeine use: Drinks coffee daily   Diet soft drinks   Social Determinants of Health   Financial Resource Strain: Not on file  Food Insecurity: Not on file  Transportation Needs: Not on file  Physical  Activity: Not on file  Stress: Not on file  Social Connections: Not on file    Family History  Problem Relation Age of Onset   Hypertension Father    Diabetes Father    Hyperlipidemia Father    High blood pressure Sister    Stroke Sister    Kidney disease Sister     Outpatient Encounter Medications as of 01/24/2023  Medication Sig   aspirin 81 MG tablet Take 81 mg by mouth daily.   atorvastatin (LIPITOR) 40 MG tablet TAKE 1 TABLET BY MOUTH EVERY DAY   Blood Glucose Monitoring Suppl (ACCU-CHEK GUIDE ME) w/Device KIT Use to check glucose daily   carvedilol (COREG) 6.25 MG tablet TAKE 1 TABLET BY MOUTH 2 TIMES DAILY WITH A MEAL   cetirizine-pseudoephedrine (ZYRTEC-D) 5-120 MG tablet Take 1 tablet by mouth daily as needed for allergies.   erythromycin ophthalmic ointment Place a 1/2 inch ribbon of ointment into the left lower eyelid BID prn.   fenofibrate (TRICOR) 48 MG tablet Take 48 mg by mouth daily.  ibuprofen (ADVIL) 200 MG tablet Take 800 mg by mouth every 6 (six) hours as needed for headache or moderate pain.   influenza vac split quadrivalent PF (FLUARIX) 0.5 ML injection Inject into the muscle.   lisinopril (ZESTRIL) 40 MG tablet TAKE 1 TABLET BY MOUTH EVERY DAY IN THE EVENING   magnesium oxide (MAG-OX) 400 MG tablet Take 400 mg by mouth daily.   neomycin-polymyxin b-dexamethasone (MAXITROL) 3.5-10000-0.1 SUSP Place 1 drop into the left eye 4 (four) times daily.   ondansetron (ZOFRAN-ODT) 4 MG disintegrating tablet Take 1 tablet (4 mg total) by mouth every 8 (eight) hours as needed for nausea or vomiting.   ONETOUCH ULTRA test strip    sildenafil (REVATIO) 20 MG tablet SMARTSIG:1-5 Tablet(s) By Mouth PRN   TRULICITY 0.75 MG/0.5ML SOPN INJECT 0.75 MG SUBCUTANEOUSLY ONE TIME PER WEEK   valACYclovir (VALTREX) 500 MG tablet Take 500 mg by mouth daily as needed (outbreaks).   Cephalexin 500 MG tablet Take 500 mg by mouth 3 (three) times daily. (Patient not taking: Reported on  01/24/2023)   doxycycline (VIBRAMYCIN) 100 MG capsule Take 1 capsule (100 mg total) by mouth 2 (two) times daily. (Patient not taking: Reported on 01/24/2023)   nitroGLYCERIN (NITROSTAT) 0.4 MG SL tablet Place 1 tablet (0.4 mg total) under the tongue every 5 (five) minutes as needed for up to 25 days for chest pain.   No facility-administered encounter medications on file as of 01/24/2023.    ALLERGIES: Not on File  VACCINATION STATUS: Immunization History  Administered Date(s) Administered   Influenza,inj,Quad PF,6+ Mos 06/22/2022   PFIZER(Purple Top)SARS-COV-2 Vaccination 10/13/2019, 11/05/2019   Tdap 03/04/2012    Diabetes He presents for his follow-up diabetic visit. He has type 2 diabetes mellitus. His disease course has been worsening. There are no hypoglycemic associated symptoms. Associated symptoms include blurred vision and fatigue. Pertinent negatives for diabetes include no polydipsia, no polyuria and no weight loss. There are no hypoglycemic complications. Symptoms are improving. There are no diabetic complications. Risk factors for coronary artery disease include diabetes mellitus, dyslipidemia, family history, hypertension and male sex. Current diabetic treatments: Trulicity. He is compliant with treatment some of the time (has skipped many doses). His weight is increasing steadily. He is following a generally unhealthy diet. When asked about meal planning, he reported none. He has not had a previous visit with a dietitian. He participates in exercise intermittently. (He presents today after long absence with no meter or logs to review.  His POCT A1c today is 9.6%, increasing from last visit of 6.1%.  He notes he has been really stressed since his last visit with family health issues, and admits he has not done well with his diet or taking his medication consistently.  He is motivated to get back on track.) An ACE inhibitor/angiotensin II receptor blocker is being taken. He does not  see a podiatrist.Eye exam is current.  Hypertension This is a chronic problem. The current episode started more than 1 year ago. The problem has been waxing and waning since onset. The problem is uncontrolled. Associated symptoms include blurred vision. There are no associated agents to hypertension. Risk factors for coronary artery disease include diabetes mellitus, dyslipidemia, family history and male gender. Past treatments include ACE inhibitors and beta blockers. The current treatment provides moderate improvement. There are no compliance problems.   Hyperlipidemia This is a chronic problem. The current episode started more than 1 year ago. The problem is uncontrolled. Recent lipid tests were reviewed and are  variable. Exacerbating diseases include diabetes. Factors aggravating his hyperlipidemia include beta blockers. Current antihyperlipidemic treatment includes statins. The current treatment provides mild improvement of lipids. Compliance problems include adherence to diet and adherence to exercise.  Risk factors for coronary artery disease include diabetes mellitus, dyslipidemia, family history, male sex and hypertension.     Review of systems  Constitutional: + increasing body weight,  current Body mass index is 32.41 kg/m. , no fatigue, no subjective hyperthermia, no subjective hypothermia Eyes: no blurry vision, no xerophthalmia ENT: no sore throat, no nodules palpated in throat, no dysphagia/odynophagia, no hoarseness Cardiovascular: no chest pain, no shortness of breath, no palpitations, no leg swelling Respiratory: no cough, no shortness of breath Gastrointestinal: no nausea/vomiting/diarrhea Musculoskeletal: no muscle/joint aches Skin: no rashes, no hyperemia Neurological: no tremors, no numbness, no tingling, no dizziness Psychiatric: no depression, no anxiety  Objective:     BP (!) 136/93 (BP Location: Left Arm, Patient Position: Sitting, Cuff Size: Large)   Pulse 66    Ht 6' (1.829 m)   Wt 239 lb (108.4 kg)   BMI 32.41 kg/m   Wt Readings from Last 3 Encounters:  01/24/23 239 lb (108.4 kg)  10/28/21 226 lb 6.4 oz (102.7 kg)  07/22/21 227 lb 12.8 oz (103.3 kg)     BP Readings from Last 3 Encounters:  01/24/23 (!) 136/93  03/22/22 116/78  10/28/21 (!) 151/95      Physical Exam- Limited  Constitutional:  Body mass index is 32.41 kg/m. , not in acute distress, normal state of mind Eyes:  EOMI, no exophthalmos Musculoskeletal: no gross deformities, strength intact in all four extremities, no gross restriction of joint movements Skin:  no rashes, no hyperemia Neurological: no tremor with outstretched hands   Diabetic Foot Exam - Simple   Simple Foot Form Diabetic Foot exam was performed with the following findings: Yes 01/24/2023 10:47 AM  Visual Inspection No deformities, no ulcerations, no other skin breakdown bilaterally: Yes Sensation Testing Intact to touch and monofilament testing bilaterally: Yes Pulse Check Posterior Tibialis and Dorsalis pulse intact bilaterally: Yes Comments     CMP ( most recent) CMP     Component Value Date/Time   NA 136 03/22/2022 1050   K 3.8 03/22/2022 1050   CL 102 03/22/2022 1050   CO2 13 (L) 03/22/2022 1050   GLUCOSE 123 (H) 03/22/2022 1050   GLUCOSE 107 (H) 11/16/2016 0709   BUN 16 03/22/2022 1050   CREATININE 0.87 03/22/2022 1050   CALCIUM 9.3 03/22/2022 1050   PROT 7.2 03/22/2022 1050   ALBUMIN 4.7 03/22/2022 1050   AST 26 03/22/2022 1050   ALT 25 03/22/2022 1050   ALKPHOS 60 03/22/2022 1050   BILITOT 0.9 03/22/2022 1050   GFRNONAA 106 07/01/2021 0000   GFRAA 114 09/16/2020 1102     Diabetic Labs (most recent): Lab Results  Component Value Date   HGBA1C 9.6 (A) 01/24/2023   HGBA1C 6.1 10/28/2021   HGBA1C 14 06/30/2021     Lipid Panel ( most recent) Lipid Panel     Component Value Date/Time   CHOL 192 09/16/2020 1102   TRIG 314 (A) 07/01/2021 0000   HDL 47 09/16/2020 1102    LDLCALC 80 07/01/2021 0000   LDLCALC 120 (H) 09/16/2020 1102   LABVLDL 25 09/16/2020 1102      Lab Results  Component Value Date   TSH 0.638 09/16/2020   TSH 0.621 11/15/2016           Assessment & Plan:  1) Type 2 diabetes mellitus with hyperglycemia, without long-term current use of insulin (HCC)  He presents today after long absence with no meter or logs to review.  His POCT A1c today is 9.6%, increasing from last visit of 6.1%.  He notes he has been really stressed since his last visit with family health issues, and admits he has not done well with his diet or taking his medication consistently.  He is motivated to get back on track.  - Derrick Duncan has currently uncontrolled symptomatic type 2 DM since 57 years of age.   -Recent labs reviewed.  - I had a long discussion with him about the progressive nature of diabetes and the pathology behind its complications. -his diabetes is not currently complicated but he remains at a high risk for more acute and chronic complications which include CAD, CVA, CKD, retinopathy, and neuropathy. These are all discussed in detail with him.  - Nutritional counseling repeated at each appointment due to patients tendency to fall back in to old habits.  - The patient admits there is a room for improvement in their diet and drink choices. -  Suggestion is made for the patient to avoid simple carbohydrates from their diet including Cakes, Sweet Desserts / Pastries, Ice Cream, Soda (diet and regular), Sweet Tea, Candies, Chips, Cookies, Sweet Pastries, Store Bought Juices, Alcohol in Excess of 1-2 drinks a day, Artificial Sweeteners, Coffee Creamer, and "Sugar-free" Products. This will help patient to have stable blood glucose profile and potentially avoid unintended weight gain.   - I encouraged the patient to switch to unprocessed or minimally processed complex starch and increased protein intake (animal or plant source), fruits, and  vegetables.   - Patient is advised to stick to a routine mealtimes to eat 3 meals a day and avoid unnecessary snacks (to snack only to correct hypoglycemia).  - I have approached him with the following individualized plan to manage his diabetes and patient agrees:   He is advised to restart taking his Trulicity 0.75 mg SQ weekly consistently.  We can avoid restarting Metformin at this time.  -He is encouraged to continue monitoring blood glucose at least once daily, before breakfast and to call the clinic if he has readings less than 70 or above 200 for 3 tests in a row.  I did send in script for new meter to his pharmacy.  - Specific targets for  A1c; LDL, HDL, and Triglycerides were discussed with the patient.  2) Blood Pressure /Hypertension:  his blood pressure is NOT controlled to target. he is advised to continue his current medications including Coreg 6.25 mg po twice daily, and Lisinopril 40 mg p.o. daily with breakfast.  3) Lipids/Hyperlipidemia:    Review of his recent lipid panel from 07/06/22 showed controlled LDL at 66.  he is advised to continue Lipitor 40 mg daily at bedtime.  Side effects and precautions discussed with him.  He is encouraged to stay away from fried foods and butter.  4)  Weight/Diet:  his Body mass index is 32.41 kg/m.  -  clearly complicating his diabetes care.   he is a candidate for moderate weight loss. I discussed with him the fact that loss of 5 - 10% of his  current body weight will have the most impact on his diabetes management.  Exercise, and detailed carbohydrates information provided  -  detailed on discharge instructions.  5) Chronic Care/Health Maintenance: -he is on ACEI/ARB and Statin medications and is encouraged  to initiate and continue to follow up with Ophthalmology, Dentist, Podiatrist at least yearly or according to recommendations, and advised to stay away from smoking. I have recommended yearly flu vaccine and pneumonia vaccine at least  every 5 years; moderate intensity exercise for up to 150 minutes weekly; and sleep for at least 7 hours a day.  - he is advised to maintain close follow up with Assunta Found, MD for primary care needs, as well as his other providers for optimal and coordinated care.      I spent  40  minutes in the care of the patient today including review of labs from CMP, Lipids, Thyroid Function, Hematology (current and previous including abstractions from other facilities); face-to-face time discussing  his blood glucose readings/logs, discussing hypoglycemia and hyperglycemia episodes and symptoms, medications doses, his options of short and long term treatment based on the latest standards of care / guidelines;  discussion about incorporating lifestyle medicine;  and documenting the encounter. Risk reduction counseling performed per USPSTF guidelines to reduce obesity and cardiovascular risk factors.     Please refer to Patient Instructions for Blood Glucose Monitoring and Insulin/Medications Dosing Guide"  in media tab for additional information. Please  also refer to " Patient Self Inventory" in the Media  tab for reviewed elements of pertinent patient history.  Derrick Duncan participated in the discussions, expressed understanding, and voiced agreement with the above plans.  All questions were answered to his satisfaction. he is encouraged to contact clinic should he have any questions or concerns prior to his return visit.     Follow up plan: - Return in about 3 months (around 04/26/2023) for Diabetes F/U with A1c in office, No previsit labs, Bring meter and logs.   Ronny Bacon, [facility] [address] [place], [place] [address] Phone: [phone] Fax: [phone]  01/24/2023, 10:49 AM

## 2023-01-24 NOTE — Patient Instructions (Signed)

## 2023-03-20 ENCOUNTER — Other Ambulatory Visit: Payer: Self-pay | Admitting: Nurse Practitioner

## 2023-04-26 ENCOUNTER — Ambulatory Visit: Payer: BC Managed Care – PPO | Admitting: Nurse Practitioner

## 2023-04-28 ENCOUNTER — Ambulatory Visit: Payer: BC Managed Care – PPO | Admitting: Nurse Practitioner

## 2023-04-28 ENCOUNTER — Encounter: Payer: Self-pay | Admitting: Nurse Practitioner

## 2023-04-28 VITALS — BP 135/87 | HR 59 | Ht 72.0 in | Wt 239.6 lb

## 2023-04-28 DIAGNOSIS — E782 Mixed hyperlipidemia: Secondary | ICD-10-CM

## 2023-04-28 DIAGNOSIS — Z7985 Long-term (current) use of injectable non-insulin antidiabetic drugs: Secondary | ICD-10-CM | POA: Diagnosis not present

## 2023-04-28 DIAGNOSIS — I10 Essential (primary) hypertension: Secondary | ICD-10-CM

## 2023-04-28 DIAGNOSIS — E1165 Type 2 diabetes mellitus with hyperglycemia: Secondary | ICD-10-CM | POA: Diagnosis not present

## 2023-04-28 LAB — POCT GLYCOSYLATED HEMOGLOBIN (HGB A1C): Hemoglobin A1C: 9.9 % — AB (ref 4.0–5.6)

## 2023-04-28 MED ORDER — OZEMPIC (0.25 OR 0.5 MG/DOSE) 2 MG/3ML ~~LOC~~ SOPN: 0.5000 mg | PEN_INJECTOR | SUBCUTANEOUS | 0 refills | Status: DC

## 2023-04-28 NOTE — Progress Notes (Signed)
Endocrinology Follow Up Note       04/28/2023, 2:53 PM   Subjective:    Patient ID: Derrick Duncan, male    DOB: 07/04/1966.  Derrick Duncan is being seen in follow up after being seen in consultation for management of currently uncontrolled symptomatic diabetes requested by  Assunta Found, MD.   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 05/05/2017   Diabetes mellitus without complication [facility])    Hypercholesteremia    Hypertension    [facility] spotted fever    Weakness of both legs 11/2016   right arm also    Past Surgical History:  Procedure Laterality Date   COLONOSCOPY N/A 07/04/2019   Procedure: COLONOSCOPY;  Surgeon: Corbin Ade, MD;  Location: [facility] END[facility];  Service: Endoscopy;  Laterality: N/A;  2:00   CRANIOTOMY     subdural hematoma    LEFT HEART CATH AND CORONARY ANGIOGRAPHY N/A 09/18/2020   Procedure: LEFT HEART CATH AND CORONARY ANGIOGRAPHY;  Surgeon: Yates Decamp, MD;  Location: [facility] INVASIVE CV LAB;  Service: Cardiovascular;  Laterality: N/A;   TONSILLECTOMY      Social History   Socioeconomic History   Marital status: Married    Spouse name: Not on file   Number of children: 4   Years of education: Mastersx3   Highest education level: Not on file  Occupational History   Not on file  Tobacco Use   Smoking status: Never   Smokeless tobacco: Former    Types: Engineer, drilling   Vaping status: Never Used  Substance and Sexual Activity   Alcohol use: Not Currently   Drug use: No   Sexual activity: Not on file  Other Topics Concern   Not on file  Social History Narrative   Lives with wife   Left handed    Caffeine use: Drinks coffee daily   Diet soft drinks   Social Determinants of Health   Financial Resource Strain: Not on file  Food Insecurity: Not on file  Transportation Needs: Not on file  Physical  Activity: Not on file  Stress: Not on file  Social Connections: Not on file    Family History  Problem Relation Age of Onset   Hypertension Father    Diabetes Father    Hyperlipidemia Father    High blood pressure Sister    Stroke Sister    Kidney disease Sister     Outpatient Encounter Medications as of 04/28/2023  Medication Sig   aspirin 81 MG tablet Take 81 mg by mouth daily.   atorvastatin (LIPITOR) 40 MG tablet TAKE 1 TABLET BY MOUTH EVERY DAY   Blood Glucose Monitoring Suppl (ACCU-CHEK GUIDE ME) w/Device KIT Use to check glucose daily   carvedilol (COREG) 6.25 MG tablet TAKE 1 TABLET BY MOUTH 2 TIMES DAILY WITH A MEAL   cetirizine-pseudoephedrine (ZYRTEC-D) 5-120 MG tablet Take 1 tablet by mouth daily as needed for allergies.   erythromycin ophthalmic ointment Place a 1/2 inch ribbon of ointment into the left lower eyelid BID prn.   fenofibrate (TRICOR) 48 MG tablet Take 48 mg by mouth daily.  ibuprofen (ADVIL) 200 MG tablet Take 800 mg by mouth every 6 (six) hours as needed for headache or moderate pain.   influenza vac split quadrivalent PF (FLUARIX) 0.5 ML injection Inject into the muscle.   lisinopril (ZESTRIL) 40 MG tablet TAKE 1 TABLET BY MOUTH EVERY DAY IN THE EVENING   magnesium oxide (MAG-OX) 400 MG tablet Take 400 mg by mouth daily.   neomycin-polymyxin b-dexamethasone (MAXITROL) 3.5-10000-0.1 SUSP Place 1 drop into the left eye 4 (four) times daily.   nitroGLYCERIN (NITROSTAT) 0.4 MG SL tablet Place 1 tablet (0.4 mg total) under the tongue every 5 (five) minutes as needed for up to 25 days for chest pain.   ondansetron (ZOFRAN-ODT) 4 MG disintegrating tablet Take 1 tablet (4 mg total) by mouth every 8 (eight) hours as needed for nausea or vomiting.   ONETOUCH ULTRA test strip    Semaglutide,0.25 or 0.5MG /DOS, (OZEMPIC, 0.25 OR 0.5 MG/DOSE,) 2 MG/3ML SOPN Inject 0.5 mg into the skin once a week.   sildenafil (REVATIO) 20 MG tablet SMARTSIG:1-5 Tablet(s) By Mouth PRN    valACYclovir (VALTREX) 500 MG tablet Take 500 mg by mouth daily as needed (outbreaks).   [DISCONTINUED] TRULICITY 0.75 MG/0.5ML SOPN INJECT 0.75 MG SUBCUTANEOUSLY ONE TIME PER WEEK   Cephalexin 500 MG tablet Take 500 mg by mouth 3 (three) times daily. (Patient not taking: Reported on 01/24/2023)   doxycycline (VIBRAMYCIN) 100 MG capsule Take 1 capsule (100 mg total) by mouth 2 (two) times daily. (Patient not taking: Reported on 01/24/2023)   No facility-administered encounter medications on file as of 04/28/2023.    ALLERGIES: Not on File  VACCINATION STATUS: Immunization History  Administered Date(s) Administered   Influenza,inj,Quad PF,6+ Mos 06/22/2022   PFIZER(Purple Top)SARS-COV-2 Vaccination 10/13/2019, 11/05/2019   Tdap 03/04/2012    Diabetes He presents for his follow-up diabetic visit. He has type 2 diabetes mellitus. His disease course has been worsening. There are no hypoglycemic associated symptoms. Associated symptoms include blurred vision and fatigue. Pertinent negatives for diabetes include no polydipsia, no polyuria and no weight loss. There are no hypoglycemic complications. Symptoms are improving. There are no diabetic complications. Risk factors for coronary artery disease include diabetes mellitus, dyslipidemia, family history, hypertension and male sex. Current diabetic treatments: Trulicity. He is compliant with treatment most of the time. His weight is fluctuating minimally. He is following a generally unhealthy diet. When asked about meal planning, he reported none. He has not had a previous visit with a dietitian. He participates in exercise intermittently. (He presents today with no meter or logs to review.  His POCT A1c today is 9.9%, increasing from last visit of 9.6%.  He was shocked by this today as he has been more consistent with taking his medications and is trying to eat better.  He still notes some family health issues causing him some stress.) An ACE  inhibitor/angiotensin II receptor blocker is being taken. He does not see a podiatrist.Eye exam is current.  Hypertension This is a chronic problem. The current episode started more than 1 year ago. The problem has been waxing and waning since onset. The problem is uncontrolled. Associated symptoms include blurred vision. There are no associated agents to hypertension. Risk factors for coronary artery disease include diabetes mellitus, dyslipidemia, family history and male gender. Past treatments include ACE inhibitors and beta blockers. The current treatment provides moderate improvement. There are no compliance problems.   Hyperlipidemia This is a chronic problem. The current episode started more than 1 year ago.  The problem is uncontrolled. Recent lipid tests were reviewed and are variable. Exacerbating diseases include diabetes. Factors aggravating his hyperlipidemia include beta blockers. Current antihyperlipidemic treatment includes statins. The current treatment provides mild improvement of lipids. Compliance problems include adherence to diet and adherence to exercise.  Risk factors for coronary artery disease include diabetes mellitus, dyslipidemia, family history, male sex and hypertension.    Review of systems  Constitutional: + Minimally fluctuating body weight,  current Body mass index is 32.5 kg/m. , no fatigue, no subjective hyperthermia, no subjective hypothermia Eyes: no blurry vision, no xerophthalmia ENT: no sore throat, no nodules palpated in throat, no dysphagia/odynophagia, no hoarseness Cardiovascular: no chest pain, no shortness of breath, no palpitations, no leg swelling Respiratory: no cough, no shortness of breath Gastrointestinal: no nausea/vomiting/diarrhea Musculoskeletal: no muscle/joint aches Skin: no rashes, no hyperemia Neurological: no tremors, no numbness, no tingling, no dizziness Psychiatric: no depression, no anxiety  Objective:     BP 135/87 (BP  Location: Left Arm, Patient Position: Sitting, Cuff Size: Large)   Pulse (!) 59   Ht 6' (1.829 m)   Wt 239 lb 9.6 oz (108.7 kg)   BMI 32.50 kg/m   Wt Readings from Last 3 Encounters:  04/28/23 239 lb 9.6 oz (108.7 kg)  01/24/23 239 lb (108.4 kg)  10/28/21 226 lb 6.4 oz (102.7 kg)     BP Readings from Last 3 Encounters:  04/28/23 135/87  01/24/23 (!) 136/93  03/22/22 116/78       Physical Exam- Limited  Constitutional:  Body mass index is 32.5 kg/m. , not in acute distress, normal state of mind Eyes:  EOMI, no exophthalmos Musculoskeletal: no gross deformities, strength intact in all four extremities, no gross restriction of joint movements Skin:  no rashes, no hyperemia Neurological: no tremor with outstretched hands   Diabetic Foot Exam - Simple   No data filed     CMP ( most recent) CMP     Component Value Date/Time   NA 136 03/22/2022 1050   K 3.8 03/22/2022 1050   CL 102 03/22/2022 1050   CO2 13 (L) 03/22/2022 1050   GLUCOSE 123 (H) 03/22/2022 1050   GLUCOSE 107 (H) 11/16/2016 0709   BUN 16 03/22/2022 1050   CREATININE 0.87 03/22/2022 1050   CALCIUM 9.3 03/22/2022 1050   PROT 7.2 03/22/2022 1050   ALBUMIN 4.7 03/22/2022 1050   AST 26 03/22/2022 1050   ALT 25 03/22/2022 1050   ALKPHOS 60 03/22/2022 1050   BILITOT 0.9 03/22/2022 1050   GFRNONAA 106 07/01/2021 0000   GFRAA 114 09/16/2020 1102     Diabetic Labs (most recent): Lab Results  Component Value Date   HGBA1C 9.9 (A) 04/28/2023   HGBA1C 9.6 (A) 01/24/2023   HGBA1C 6.1 10/28/2021     Lipid Panel ( most recent) Lipid Panel     Component Value Date/Time   CHOL 192 09/16/2020 1102   TRIG 314 (A) 07/01/2021 0000   HDL 47 09/16/2020 1102   LDLCALC 80 07/01/2021 0000   LDLCALC 120 (H) 09/16/2020 1102   LABVLDL 25 09/16/2020 1102      Lab Results  Component Value Date   TSH 0.638 09/16/2020   TSH 0.621 11/15/2016           Assessment & Plan:   1) Type 2 diabetes mellitus  with hyperglycemia, without long-term current use of insulin (HCC)  He presents today with no meter or logs to review.  His POCT A1c today is  9.9%, increasing from last visit of 9.6%.  He was shocked by this today as he has been more consistent with taking his medications and is trying to eat better.  He still notes some family health issues causing him some stress.  - Derrick Duncan has currently uncontrolled symptomatic type 2 DM since 57 years of age.   -Recent labs reviewed.  - I had a long discussion with him about the progressive nature of diabetes and the pathology behind its complications. -his diabetes is not currently complicated but he remains at a high risk for more acute and chronic complications which include CAD, CVA, CKD, retinopathy, and neuropathy. These are all discussed in detail with him.  - Nutritional counseling repeated at each appointment due to patients tendency to fall back in to old habits.  - The patient admits there is a room for improvement in their diet and drink choices. -  Suggestion is made for the patient to avoid simple carbohydrates from their diet including Cakes, Sweet Desserts / Pastries, Ice Cream, Soda (diet and regular), Sweet Tea, Candies, Chips, Cookies, Sweet Pastries, Store Bought Juices, Alcohol in Excess of 1-2 drinks a day, Artificial Sweeteners, Coffee Creamer, and "Sugar-free" Products. This will help patient to have stable blood glucose profile and potentially avoid unintended weight gain.   - I encouraged the patient to switch to unprocessed or minimally processed complex starch and increased protein intake (animal or plant source), fruits, and vegetables.   - Patient is advised to stick to a routine mealtimes to eat 3 meals a day and avoid unnecessary snacks (to snack only to correct hypoglycemia).  - I have approached him with the following individualized plan to manage his diabetes and patient agrees:   Will change his medication  to Ozempic 0.5 mg SQ weekly (sample pen provided).  Trulicity seemed to be ineffective for him.  -He is encouraged to continue monitoring blood glucose at least once daily 2-3 times per week, before breakfast and to call the clinic if he has readings less than 70 or above 200 for 3 tests in a row.   - Specific targets for  A1c; LDL, HDL, and Triglycerides were discussed with the patient.  2) Blood Pressure /Hypertension:  his blood pressure is controlled to target. he is advised to continue his current medications including Coreg 6.25 mg po twice daily, and Lisinopril 40 mg p.o. daily with breakfast.  3) Lipids/Hyperlipidemia:    Review of his recent lipid panel from 07/06/22 showed controlled LDL at 66.  he is advised to continue Lipitor 40 mg daily at bedtime.  Side effects and precautions discussed with him.  He is encouraged to stay away from fried foods and butter.  Will recheck lipid panel prior to next visit.  4)  Weight/Diet:  his Body mass index is 32.5 kg/m.  -  clearly complicating his diabetes care.   he is a candidate for moderate weight loss. I discussed with him the fact that loss of 5 - 10% of his  current body weight will have the most impact on his diabetes management.  Exercise, and detailed carbohydrates information provided  -  detailed on discharge instructions.  5) Chronic Care/Health Maintenance: -he is on ACEI/ARB and Statin medications and is encouraged to initiate and continue to follow up with Ophthalmology, Dentist, Podiatrist at least yearly or according to recommendations, and advised to stay away from smoking. I have recommended yearly flu vaccine and pneumonia vaccine at least every 5 years;  moderate intensity exercise for up to 150 minutes weekly; and sleep for at least 7 hours a day.  - he is advised to maintain close follow up with Assunta Found, MD for primary care needs, as well as his other providers for optimal and coordinated care.      I spent  47   minutes in the care of the patient today including review of labs from CMP, Lipids, Thyroid Function, Hematology (current and previous including abstractions from other facilities); face-to-face time discussing  his blood glucose readings/logs, discussing hypoglycemia and hyperglycemia episodes and symptoms, medications doses, his options of short and long term treatment based on the latest standards of care / guidelines;  discussion about incorporating lifestyle medicine;  and documenting the encounter. Risk reduction counseling performed per USPSTF guidelines to reduce obesity and cardiovascular risk factors.     Please refer to Patient Instructions for Blood Glucose Monitoring and Insulin/Medications Dosing Guide"  in media tab for additional information. Please  also refer to " Patient Self Inventory" in the Media  tab for reviewed elements of pertinent patient history.  Derrick Duncan participated in the discussions, expressed understanding, and voiced agreement with the above plans.  All questions were answered to his satisfaction. he is encouraged to contact clinic should he have any questions or concerns prior to his return visit.     Follow up plan: - Return in about 3 months (around 07/28/2023) for Diabetes F/U with A1c in office, Previsit labs.   Ronny Bacon, [facility] [address] [place], [place] [address] Phone: [phone] Fax: [phone]  04/28/2023, 2:53 PM

## 2023-05-24 ENCOUNTER — Telehealth: Payer: Self-pay

## 2023-05-24 ENCOUNTER — Other Ambulatory Visit (HOSPITAL_COMMUNITY): Payer: Self-pay

## 2023-05-24 NOTE — Telephone Encounter (Signed)
Pharmacy Patient Advocate Encounter   Received notification from CoverMyMeds that prior authorization for Ozempic is required/requested.   Per test claim: PA required; PA submitted to [facility] via CoverMyMeds Key/confirmation #/EOC [id number] Status is pending

## 2023-05-26 ENCOUNTER — Other Ambulatory Visit (HOSPITAL_COMMUNITY): Payer: Self-pay

## 2023-05-26 NOTE — Telephone Encounter (Signed)
Pharmacy Patient Advocate Encounter  Received notification from [facility] that Prior Authorization for Ozempic has been APPROVED from 05/24/23 to 05/23/26. Ran test claim, Copay is $74.99 (for 3 month supply). This test claim was processed through [facility]- copay amounts may vary at other pharmacies due to pharmacy/plan contracts, or as the patient moves through the different stages of their insurance plan.

## 2023-08-03 ENCOUNTER — Ambulatory Visit: Payer: BC Managed Care – PPO | Admitting: Nurse Practitioner

## 2023-08-21 ENCOUNTER — Other Ambulatory Visit: Payer: Self-pay | Admitting: Nurse Practitioner

## 2023-08-21 DIAGNOSIS — E1165 Type 2 diabetes mellitus with hyperglycemia: Secondary | ICD-10-CM

## 2023-10-06 ENCOUNTER — Telehealth: Payer: Self-pay | Admitting: Nurse Practitioner

## 2023-10-06 ENCOUNTER — Other Ambulatory Visit: Payer: Self-pay | Admitting: *Deleted

## 2023-10-06 DIAGNOSIS — E1165 Type 2 diabetes mellitus with hyperglycemia: Secondary | ICD-10-CM

## 2023-10-06 DIAGNOSIS — Z7985 Long-term (current) use of injectable non-insulin antidiabetic drugs: Secondary | ICD-10-CM

## 2023-10-06 DIAGNOSIS — I10 Essential (primary) hypertension: Secondary | ICD-10-CM

## 2023-10-06 DIAGNOSIS — E782 Mixed hyperlipidemia: Secondary | ICD-10-CM

## 2023-10-06 NOTE — Telephone Encounter (Signed)
 Labs updated

## 2023-10-06 NOTE — Telephone Encounter (Signed)
 Pt needs labs updated

## 2023-11-22 ENCOUNTER — Telehealth: Payer: Self-pay

## 2023-11-22 ENCOUNTER — Telehealth: Payer: Self-pay | Admitting: *Deleted

## 2023-11-22 NOTE — Telephone Encounter (Signed)
 Unfortunately I do not have a sooner appt available at this time.  Diet choices are going to be a HUGE contributor to his readings AND on how he feels as a result.  He needs to stay away from processed sweets and the diet coke.  My last OV note says he is on Ozempic 0.5 mg.  Did someone else increase it?  If so, how is he feeling on that dose- any unpleasant side effects- nausea, vomiting, gas, bloating, diarrhea, constipation, etc?  We may look to increase that medication but I need to know how much he has been doing and for how long (do not want to advance too quickly as then he would be at increased risk for those unpleasant side effects).

## 2023-11-22 NOTE — Telephone Encounter (Signed)
 Noted  Transfer okay  See other telephone note

## 2023-11-22 NOTE — Telephone Encounter (Signed)
 Patient's wife called and left a message. She shared that her husband had been experiencing high blood sugars. She mentioned that this morning his fasting blood sugar was 256. He is not feeling good. He has a follow up in May but she is hoping we can help him before then.  Talked with Amy. She said that he does not check his blood sugar daily but the times he has taken it over the last two weeks, it has been in the 200's are higher. She states that he is irritable. He is injecting Ozempic 1 mg weekly. His diet consist of cookies, Little Debbie's. He eats salads and green beans. Drinks water but will have 1 diet coke a day.  Amy feels that the patient needs something else or an appointment sooner.

## 2023-11-22 NOTE — Telephone Encounter (Signed)
 Talked with his wife, Amy. She corrected herself about the Ozempic dose, she shares that he is only injecting the 0.5 mg weekly. He has not experienced any side effects at this time. I gave her Whitney's recommendation about the patient's diet and also shared that the patient needs to start checking his blood sugars twice a day. In the morning before breakfast and at bedtime. Ask that he bring those readings in with him at the time of his office visit. If they have any questions and or concerns prior to the appointment to call our office back.

## 2023-11-22 NOTE — Telephone Encounter (Signed)
 Copied from CRM [id number]. Topic: Appointments - Appointment Scheduling >> Nov 22, 2023  8:42 AM Arley Phenix D wrote: Patient's wife called in to schedule a new patient appointment for the patient. Wife requested NP Rennie Plowman to be his PCP and started the transfer of care process. I informed the Wife that Claris Che didn't have anything available until December for an appointment. Wife wants to know if someone can speak to [place] regarding this because she also sees Claris Che and would like to see if Claris Che is able to see the patient sooner.

## 2023-11-22 NOTE — Telephone Encounter (Signed)
 Copied from CRM [id number]. Topic: Appointments - Transfer of Care >> Nov 22, 2023  8:39 AM Arley Phenix D wrote: Pt is requesting to transfer FROM: [facility], Dr.John Phillips Odor Pt is requesting to transfer TO: [facility], NP Rennie Plowman Reason for requested transfer: Looking for a new PCP It is the responsibility of the team the patient would like to transfer to (Dr. Jason Coop) to reach out to the patient if for any reason this transfer is not acceptable.

## 2023-11-24 ENCOUNTER — Other Ambulatory Visit: Payer: Self-pay | Admitting: Nurse Practitioner

## 2023-11-24 DIAGNOSIS — E1165 Type 2 diabetes mellitus with hyperglycemia: Secondary | ICD-10-CM

## 2023-12-06 LAB — HEMOGLOBIN A1C: A1c: 11.5

## 2023-12-07 LAB — LAB REPORT - SCANNED
Albumin, Urine POC: 31.1
Creatinine, POC: 136.6 mg/dL
EGFR: 100
Microalb Creat Ratio: 23
TSH: 0.37 — AB (ref 0.41–5.90)

## 2023-12-13 ENCOUNTER — Encounter: Payer: Self-pay | Admitting: *Deleted

## 2023-12-21 ENCOUNTER — Other Ambulatory Visit: Payer: Self-pay | Admitting: Nurse Practitioner

## 2023-12-21 DIAGNOSIS — E1165 Type 2 diabetes mellitus with hyperglycemia: Secondary | ICD-10-CM

## 2023-12-22 ENCOUNTER — Ambulatory Visit: Payer: Self-pay | Admitting: Nurse Practitioner

## 2023-12-22 ENCOUNTER — Encounter: Payer: Self-pay | Admitting: Nurse Practitioner

## 2023-12-22 VITALS — BP 116/82 | HR 99 | Ht 72.0 in | Wt 228.4 lb

## 2023-12-22 DIAGNOSIS — Z7985 Long-term (current) use of injectable non-insulin antidiabetic drugs: Secondary | ICD-10-CM | POA: Diagnosis not present

## 2023-12-22 DIAGNOSIS — I10 Essential (primary) hypertension: Secondary | ICD-10-CM | POA: Diagnosis not present

## 2023-12-22 DIAGNOSIS — E1165 Type 2 diabetes mellitus with hyperglycemia: Secondary | ICD-10-CM | POA: Diagnosis not present

## 2023-12-22 DIAGNOSIS — E782 Mixed hyperlipidemia: Secondary | ICD-10-CM | POA: Diagnosis not present

## 2023-12-22 DIAGNOSIS — R7989 Other specified abnormal findings of blood chemistry: Secondary | ICD-10-CM

## 2023-12-22 LAB — POCT GLYCOSYLATED HEMOGLOBIN (HGB A1C): Hemoglobin A1C: 11.5 % — AB (ref 4.0–5.6)

## 2023-12-22 MED ORDER — SEMAGLUTIDE (1 MG/DOSE) 4 MG/3ML ~~LOC~~ SOPN: 1.0000 mg | PEN_INJECTOR | SUBCUTANEOUS | 1 refills | Status: DC

## 2023-12-22 NOTE — Progress Notes (Signed)
 Endocrinology Follow Up Note       12/23/2023, 7:22 AM   Subjective:    Patient ID: Derrick Duncan, male    DOB: Sep 19, 1965.  Derrick Duncan is being seen in follow up after being seen in consultation for management of currently uncontrolled symptomatic diabetes requested by  Minus Amel, MD.   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 05/05/2017   Diabetes mellitus without complication [facility])    Hypercholesteremia    Hypertension    [facility] spotted fever    Weakness of both legs 11/2016   right arm also    Past Surgical History:  Procedure Laterality Date   COLONOSCOPY N/A 07/04/2019   Procedure: COLONOSCOPY;  Surgeon: Suzette Espy, MD;  Location: [facility] END[facility];  Service: Endoscopy;  Laterality: N/A;  2:00   CRANIOTOMY     subdural hematoma    LEFT HEART CATH AND CORONARY ANGIOGRAPHY N/A 09/18/2020   Procedure: LEFT HEART CATH AND CORONARY ANGIOGRAPHY;  Surgeon: Knox Perl, MD;  Location: [facility] INVASIVE CV LAB;  Service: Cardiovascular;  Laterality: N/A;   TONSILLECTOMY      Social History   Socioeconomic History   Marital status: Married    Spouse name: Not on file   Number of children: 4   Years of education: Mastersx3   Highest education level: Not on file  Occupational History   Not on file  Tobacco Use   Smoking status: Never   Smokeless tobacco: Former    Types: Engineer, drilling   Vaping status: Never Used  Substance and Sexual Activity   Alcohol use: Not Currently   Drug use: No   Sexual activity: Not on file  Other Topics Concern   Not on file  Social History Narrative   Lives with wife   Left handed    Caffeine use: Drinks coffee daily   Diet soft drinks   Social Drivers of Corporate investment banker Strain: Not on file  Food Insecurity: Not on file  Transportation Needs: Not on file  Physical  Activity: Not on file  Stress: Not on file  Social Connections: Not on file    Family History  Problem Relation Age of Onset   Hypertension Father    Diabetes Father    Hyperlipidemia Father    High blood pressure Sister    Stroke Sister    Kidney disease Sister     Outpatient Encounter Medications as of 12/22/2023  Medication Sig   aspirin  81 MG tablet Take 81 mg by mouth daily.   atorvastatin  (LIPITOR) 40 MG tablet TAKE 1 TABLET BY MOUTH EVERY DAY   Blood Glucose Monitoring Suppl (ACCU-CHEK GUIDE ME) w/Device KIT Use to check glucose daily   carvedilol  (COREG ) 6.25 MG tablet TAKE 1 TABLET BY MOUTH 2 TIMES DAILY WITH A MEAL   cetirizine-pseudoephedrine (ZYRTEC-D) 5-120 MG tablet Take 1 tablet by mouth daily as needed for allergies.   fenofibrate (TRICOR) 48 MG tablet Take 48 mg by mouth daily.   ibuprofen  (ADVIL ) 200 MG tablet Take 800 mg by mouth every 6 (six) hours as needed for  headache or moderate pain.   influenza vac split quadrivalent PF (FLUARIX) 0.5 ML injection Inject into the muscle.   lisinopril  (ZESTRIL ) 40 MG tablet TAKE 1 TABLET BY MOUTH EVERY DAY IN THE EVENING   magnesium oxide (MAG-OX) 400 MG tablet Take 400 mg by mouth daily.   neomycin-polymyxin b-dexamethasone (MAXITROL) 3.5-10000-0.1 SUSP Place 1 drop into the left eye 4 (four) times daily.   ondansetron  (ZOFRAN -ODT) 4 MG disintegrating tablet Take 1 tablet (4 mg total) by mouth every 8 (eight) hours as needed for nausea or vomiting.   ONETOUCH ULTRA test strip    Semaglutide , 1 MG/DOSE, 4 MG/3ML SOPN Inject 1 mg as directed once a week.   sildenafil (REVATIO) 20 MG tablet SMARTSIG:1-5 Tablet(s) By Mouth PRN   valACYclovir (VALTREX) 500 MG tablet Take 500 mg by mouth daily as needed (outbreaks).   [DISCONTINUED] Semaglutide ,0.25 or 0.5MG /DOS, (OZEMPIC , 0.25 OR 0.5 MG/DOSE,) 2 MG/3ML SOPN INJECT 0.5 MG INTO THE SKIN ONE TIME PER WEEK   Cephalexin 500 MG tablet Take 500 mg by mouth 3 (three) times daily. (Patient  not taking: Reported on 12/22/2023)   doxycycline  (VIBRAMYCIN ) 100 MG capsule Take 1 capsule (100 mg total) by mouth 2 (two) times daily. (Patient not taking: Reported on 12/22/2023)   erythromycin  ophthalmic ointment Place a 1/2 inch ribbon of ointment into the left lower eyelid BID prn. (Patient not taking: Reported on 12/22/2023)   nitroGLYCERIN  (NITROSTAT ) 0.4 MG SL tablet Place 1 tablet (0.4 mg total) under the tongue every 5 (five) minutes as needed for up to 25 days for chest pain.   No facility-administered encounter medications on file as of 12/22/2023.    ALLERGIES: Not on File  VACCINATION STATUS: Immunization History  Administered Date(s) Administered   Influenza,inj,Quad PF,6+ Mos 06/22/2022   PFIZER(Purple Top)SARS-COV-2 Vaccination 10/13/2019, 11/05/2019   Tdap 03/04/2012    Diabetes He presents for his follow-up diabetic visit. He has type 2 diabetes mellitus. His disease course has been worsening. There are no hypoglycemic associated symptoms. Associated symptoms include blurred vision, fatigue, foot paresthesias, polydipsia, polyuria and weight loss. There are no hypoglycemic complications. Symptoms are improving. There are no diabetic complications. Risk factors for coronary artery disease include diabetes mellitus, dyslipidemia, family history, hypertension and male sex. Current diabetic treatments: Ozempic . He is compliant with treatment most of the time. His weight is decreasing steadily. He is following a generally unhealthy diet. When asked about meal planning, he reported none. He has not had a previous visit with a dietitian. He participates in exercise intermittently. (He presents today, accompanied by his wife, with no meter or logs to review.  He has run out of strips to check glucose at home.  His most recent A1c, checked on 4/22 was 11.5%, increasing drastically from last A1c of 9.9%.  He notes he has been taking the Ozempic  as prescribed but he has NOT been eating healthy.   He endorses increased stress recently.  Some of his labs were off with PCP recently (TSH and calcium  levels) and they wanted us  to look a bit deeper on those issues as well.) An ACE inhibitor/angiotensin II receptor blocker is being taken. He does not see a podiatrist.Eye exam is current.  Hypertension This is a chronic problem. The current episode started more than 1 year ago. The problem has been waxing and waning since onset. The problem is uncontrolled. Associated symptoms include blurred vision. There are no associated agents to hypertension. Risk factors for coronary artery disease include diabetes mellitus, dyslipidemia,  family history and male gender. Past treatments include ACE inhibitors and beta blockers. The current treatment provides moderate improvement. There are no compliance problems.   Hyperlipidemia This is a chronic problem. The current episode started more than 1 year ago. The problem is uncontrolled. Recent lipid tests were reviewed and are variable. Exacerbating diseases include diabetes. Factors aggravating his hyperlipidemia include beta blockers. Current antihyperlipidemic treatment includes statins. The current treatment provides mild improvement of lipids. Compliance problems include adherence to diet and adherence to exercise.  Risk factors for coronary artery disease include diabetes mellitus, dyslipidemia, family history, male sex and hypertension.    Review of systems  Constitutional: + decreasing body weight,  current Body mass index is 30.98 kg/m. , no fatigue, no subjective hyperthermia, no subjective hypothermia, + increased thirst, + polyuria Eyes: no blurry vision, no xerophthalmia ENT: no sore throat, no nodules palpated in throat, no dysphagia/odynophagia, no hoarseness Cardiovascular: no chest pain, no shortness of breath, no palpitations, no leg swelling Respiratory: no cough, no shortness of breath Gastrointestinal: no  nausea/vomiting/diarrhea Musculoskeletal: no muscle/joint aches Skin: no rashes, no hyperemia Neurological: no tremors, + mild numbness/ tingling to hands and feet, no dizziness Psychiatric: no depression, no anxiety, + insomnia  Objective:     BP 116/82 (BP Location: Left Arm, Patient Position: Sitting, Cuff Size: Large)   Pulse 99   Ht 6' (1.829 m)   Wt 228 lb 6.4 oz (103.6 kg)   BMI 30.98 kg/m   Wt Readings from Last 3 Encounters:  12/22/23 228 lb 6.4 oz (103.6 kg)  04/28/23 239 lb 9.6 oz (108.7 kg)  01/24/23 239 lb (108.4 kg)     BP Readings from Last 3 Encounters:  12/22/23 116/82  04/28/23 135/87  01/24/23 (!) 136/93       Physical Exam- Limited  Constitutional:  Body mass index is 30.98 kg/m. , not in acute distress, normal state of mind Eyes:  EOMI, no exophthalmos Musculoskeletal: no gross deformities, strength intact in all four extremities, no gross restriction of joint movements Skin:  no rashes, no hyperemia Neurological: no tremor with outstretched hands   Diabetic Foot Exam - Simple   No data filed     CMP ( most recent) CMP     Component Value Date/Time   NA 136 03/22/2022 1050   K 3.8 03/22/2022 1050   CL 102 03/22/2022 1050   CO2 13 (L) 03/22/2022 1050   GLUCOSE 123 (H) 03/22/2022 1050   GLUCOSE 107 (H) 11/16/2016 0709   BUN 16 03/22/2022 1050   CREATININE 0.87 03/22/2022 1050   CALCIUM  9.3 03/22/2022 1050   PROT 7.2 03/22/2022 1050   ALBUMIN 4.7 03/22/2022 1050   AST 26 03/22/2022 1050   ALT 25 03/22/2022 1050   ALKPHOS 60 03/22/2022 1050   BILITOT 0.9 03/22/2022 1050   GFRNONAA 106 07/01/2021 0000   GFRAA 114 09/16/2020 1102     Diabetic Labs (most recent): Lab Results  Component Value Date   HGBA1C 11.5 (A) 12/22/2023   HGBA1C 9.9 (A) 04/28/2023   HGBA1C 9.6 (A) 01/24/2023     Lipid Panel ( most recent) Lipid Panel     Component Value Date/Time   CHOL 192 09/16/2020 1102   TRIG 314 (A) 07/01/2021 0000   HDL 47  09/16/2020 1102   LDLCALC 80 07/01/2021 0000   LDLCALC 120 (H) 09/16/2020 1102   LABVLDL 25 09/16/2020 1102      Lab Results  Component Value Date   TSH 0.638 09/16/2020  TSH 0.621 11/15/2016           Assessment & Plan:   1) Type 2 diabetes mellitus with hyperglycemia, without long-term current use of insulin  (HCC)  He presents today, accompanied by his wife, with no meter or logs to review.  He has run out of strips to check glucose at home.  His most recent A1c, checked on 4/22 was 11.5%, increasing drastically from last A1c of 9.9%.  He notes he has been taking the Ozempic  as prescribed but he has NOT been eating healthy.  He endorses increased stress recently.  Some of his labs were off with PCP recently (TSH and calcium  levels) and they wanted us  to look a bit deeper on those issues as well.  Derrick Duncan has currently uncontrolled symptomatic type 2 DM since 58 years of age.   -Recent labs reviewed.  - I had a long discussion with him about the progressive nature of diabetes and the pathology behind its complications. -his diabetes is not currently complicated but he remains at a high risk for more acute and chronic complications which include CAD, CVA, CKD, retinopathy, and neuropathy. These are all discussed in detail with him.  - Nutritional counseling repeated at each appointment due to patients tendency to fall back in to old habits.  - The patient admits there is a room for improvement in their diet and drink choices. -  Suggestion is made for the patient to avoid simple carbohydrates from their diet including Cakes, Sweet Desserts / Pastries, Ice Cream, Soda (diet and regular), Sweet Tea, Candies, Chips, Cookies, Sweet Pastries, Store Bought Juices, Alcohol in Excess of 1-2 drinks a day, Artificial Sweeteners, Coffee Creamer, and "Sugar-free" Products. This will help patient to have stable blood glucose profile and potentially avoid unintended weight gain.    - I encouraged the patient to switch to unprocessed or minimally processed complex starch and increased protein intake (animal or plant source), fruits, and vegetables.   - Patient is advised to stick to a routine mealtimes to eat 3 meals a day and avoid unnecessary snacks (to snack only to correct hypoglycemia).  - I have approached him with the following individualized plan to manage his diabetes and patient agrees:   I did increase his Ozempic  to 1 mg SQ weekly.  He wanted to wait for 2 more weeks and work better on his diet, but after explaining this will only help, he reluctantly agreed.  He understands that we may be needing basal insulin  if we cannot regain control on his current regimen and improved diet.  -He is encouraged to continue monitoring blood glucose at least twice daily, before breakfast  and before bed, and to call the clinic if he has readings less than 70 or above 200 for 3 tests in a row.  I asked that he update me with readings in 2 weeks.  - Specific targets for  A1c; LDL, HDL, and Triglycerides were discussed with the patient.  2) Blood Pressure /Hypertension:  his blood pressure is controlled to target. he is advised to continue his current medications as prescribed by his PCP.  3) Lipids/Hyperlipidemia:    Review of his recent lipid panel from 12/06/23 showed controlled LDL at 91.  he is advised to continue Lipitor 40 mg daily at bedtime.  Side effects and precautions discussed with him.  He is encouraged to stay away from fried foods and butter.    4)  Weight/Diet:  his Body mass index  is 30.98 kg/m.  -  clearly complicating his diabetes care.   he is a candidate for moderate weight loss. I discussed with him the fact that loss of 5 - 10% of his  current body weight will have the most impact on his diabetes management.  Exercise, and detailed carbohydrates information provided  -  detailed on discharge instructions.  5) Chronic Care/Health Maintenance: -he is on  ACEI/ARB and Statin medications and is encouraged to initiate and continue to follow up with Ophthalmology, Dentist, Podiatrist at least yearly or according to recommendations, and advised to stay away from smoking. I have recommended yearly flu vaccine and pneumonia vaccine at least every 5 years; moderate intensity exercise for up to 150 minutes weekly; and sleep for at least 7 hours a day.  6) Abnormal TSH Recent labs show suppressed TSH, possibly indicating hyperthyroidism.  He does not know of any family history of thyroid dysfunction.  Has not recently been on steroids, no use of Amiodarone or Lithium in the past.  He does note some insomnia and unexplained weight loss but this could be related to his uncontrolled diabetes as well.  Will do more comprehensive thyroid panel to assess further.  Will reach out to patient with results and next steps.  7) Hypercalcemia On recent labs, calcium  level was noted to be high at 11.4.  It has been mildly elevated in the past but not consistent.  He does take a multivitamin with calcium  in it.  He does not use Tums in excess or eat dairy in excess.  He has no known family history of parathyroid problems, pituitary tumors.  He has never had bone density exam in the past.  No history of arrhythmias, kidney stones, osteopenia or osteoporosis, no abdominal pain, no bone pain, no significant mood disorders.    We did talk about the pathophysiology of high calcium  levels in the blood but we do not have enough information to determine etiology.  Will check CMP, Vitamin D, magnesium, phosphorous, PTH, PTH-rp to start.   - he is advised to maintain close follow up with Minus Amel, MD for primary care needs, as well as his other providers for optimal and coordinated care.      I spent  51  minutes in the care of the patient today including review of labs from CMP, Lipids, Thyroid Function, Hematology (current and previous including abstractions from other  facilities); face-to-face time discussing  his blood glucose readings/logs, discussing hypoglycemia and hyperglycemia episodes and symptoms, medications doses, his options of short and long term treatment based on the latest standards of care / guidelines;  discussion about incorporating lifestyle medicine;  and documenting the encounter. Risk reduction counseling performed per USPSTF guidelines to reduce obesity and cardiovascular risk factors.     Please refer to Patient Instructions for Blood Glucose Monitoring and Insulin /Medications Dosing Guide"  in media tab for additional information. Please  also refer to " Patient Self Inventory" in the Media  tab for reviewed elements of pertinent patient history.  Derrick Duncan participated in the discussions, expressed understanding, and voiced agreement with the above plans.  All questions were answered to his satisfaction. he is encouraged to contact clinic should he have any questions or concerns prior to his return visit.     Follow up plan: - Return in about 3 months (around 03/23/2024) for Diabetes F/U with A1c in office, Previsit labs, Bring meter and logs.   Hulon Magic, [facility] [phone]  [address] [place], [place] [address] Phone: [phone] Fax: [phone]  12/23/2023, 7:22 AM

## 2023-12-26 ENCOUNTER — Encounter: Payer: Self-pay | Admitting: Nurse Practitioner

## 2023-12-26 NOTE — Progress Notes (Signed)
FYI: I sent mychart message going over recent labs.

## 2023-12-28 ENCOUNTER — Ambulatory Visit: Payer: Self-pay | Admitting: *Deleted

## 2023-12-28 DIAGNOSIS — R7989 Other specified abnormal findings of blood chemistry: Secondary | ICD-10-CM

## 2023-12-28 NOTE — Telephone Encounter (Signed)
 Noted, [name] has sent the patient a MyChart message going over results.

## 2023-12-28 NOTE — Telephone Encounter (Signed)
-----   Message from Wendel Hals sent at 12/26/2023  7:12 AM EDT ----- Drew Gentleman sent mychart message going over recent labs.

## 2023-12-28 NOTE — Progress Notes (Signed)
 Noted, [name] has sent the patient a MyChart message going over results.

## 2024-01-02 LAB — COMPREHENSIVE METABOLIC PANEL WITH GFR
ALT: 29 IU/L (ref 0–44)
AST: 23 IU/L (ref 0–40)
Albumin: 4.7 g/dL (ref 3.8–4.9)
Alkaline Phosphatase: 81 IU/L (ref 44–121)
BUN/Creatinine Ratio: 22 — ABNORMAL HIGH (ref 9–20)
BUN: 19 mg/dL (ref 6–24)
Bilirubin Total: 1.6 mg/dL — ABNORMAL HIGH (ref 0.0–1.2)
CO2: 22 mmol/L (ref 20–29)
Calcium: 10.3 mg/dL — ABNORMAL HIGH (ref 8.7–10.2)
Chloride: 103 mmol/L (ref 96–106)
Creatinine, Ser: 0.87 mg/dL (ref 0.76–1.27)
Globulin, Total: 2.6 g/dL (ref 1.5–4.5)
Glucose: 176 mg/dL — ABNORMAL HIGH (ref 70–99)
Potassium: 4.4 mmol/L (ref 3.5–5.2)
Sodium: 139 mmol/L (ref 134–144)
Total Protein: 7.3 g/dL (ref 6.0–8.5)
eGFR: 101 mL/min/{1.73_m2} (ref >59)

## 2024-01-02 LAB — PTH, INTACT AND CALCIUM: PTH: 24 pg/mL (ref 15–65)

## 2024-01-02 LAB — THYROID PEROXIDASE ANTIBODY: Thyroperoxidase Ab SerPl-aCnc: 11 [IU]/mL (ref 0–34)

## 2024-01-02 LAB — T3, FREE: T3, Free: 3.3 pg/mL (ref 2.0–4.4)

## 2024-01-02 LAB — PTH-RELATED PEPTIDE: PTH-related peptide: <2 pmol/L

## 2024-01-02 LAB — T4, FREE: Free T4: 1.18 ng/dL (ref 0.82–1.77)

## 2024-01-02 LAB — TSH: TSH: 0.319 u[IU]/mL — ABNORMAL LOW (ref 0.450–4.500)

## 2024-01-02 LAB — THYROGLOBULIN ANTIBODY: Thyroglobulin Antibody: <1 [IU]/mL (ref 0.0–0.9)

## 2024-01-02 LAB — VITAMIN D 25 HYDROXY (VIT D DEFICIENCY, FRACTURES): Vit D, 25-Hydroxy: 23.2 ng/mL — ABNORMAL LOW (ref 30.0–100.0)

## 2024-01-02 LAB — PHOSPHORUS: Phosphorus: 2.8 mg/dL (ref 2.8–4.1)

## 2024-01-02 LAB — MAGNESIUM: Magnesium: 1.8 mg/dL (ref 1.6–2.3)

## 2024-01-03 ENCOUNTER — Telehealth: Payer: Self-pay | Admitting: Nurse Practitioner

## 2024-01-03 NOTE — Addendum Note (Signed)
 Addended by: Reylynn Vanalstine J on: 01/03/2024 08:35 AM   Modules accepted: Orders

## 2024-01-03 NOTE — Telephone Encounter (Signed)
 Called pt's wife and lvm to call our office to reschedule his appt and to please read the message in [place] from [place]

## 2024-01-11 ENCOUNTER — Encounter: Payer: Self-pay | Admitting: Nurse Practitioner

## 2024-04-07 LAB — TSH: TSH: 0.419 u[IU]/mL — ABNORMAL LOW (ref 0.450–4.500)

## 2024-04-07 LAB — COMPREHENSIVE METABOLIC PANEL WITH GFR
ALT: 23 IU/L (ref 0–44)
AST: 20 IU/L (ref 0–40)
Albumin: 4.5 g/dL (ref 3.8–4.9)
Alkaline Phosphatase: 82 IU/L (ref 44–121)
BUN/Creatinine Ratio: 14 (ref 9–20)
BUN: 11 mg/dL (ref 6–24)
Bilirubin Total: 1.4 mg/dL — ABNORMAL HIGH (ref 0.0–1.2)
CO2: 22 mmol/L (ref 20–29)
Calcium: 10 mg/dL (ref 8.7–10.2)
Chloride: 106 mmol/L (ref 96–106)
Creatinine, Ser: 0.8 mg/dL (ref 0.76–1.27)
Globulin, Total: 2.5 g/dL (ref 1.5–4.5)
Glucose: 129 mg/dL — ABNORMAL HIGH (ref 70–99)
Potassium: 4.1 mmol/L (ref 3.5–5.2)
Sodium: 141 mmol/L (ref 134–144)
Total Protein: 7 g/dL (ref 6.0–8.5)
eGFR: 103 mL/min/1.73 (ref >59)

## 2024-04-07 LAB — T4, FREE: Free T4: 1.16 ng/dL (ref 0.82–1.77)

## 2024-04-07 LAB — T3, FREE: T3, Free: 3.6 pg/mL (ref 2.0–4.4)

## 2024-04-12 ENCOUNTER — Ambulatory Visit: Admitting: Nurse Practitioner

## 2024-04-17 ENCOUNTER — Encounter: Payer: Self-pay | Admitting: Nurse Practitioner

## 2024-04-17 ENCOUNTER — Ambulatory Visit: Admitting: Nurse Practitioner

## 2024-04-17 VITALS — BP 138/88 | HR 68 | Ht 72.0 in | Wt 240.2 lb

## 2024-04-17 DIAGNOSIS — I10 Essential (primary) hypertension: Secondary | ICD-10-CM

## 2024-04-17 DIAGNOSIS — E1165 Type 2 diabetes mellitus with hyperglycemia: Secondary | ICD-10-CM

## 2024-04-17 DIAGNOSIS — E782 Mixed hyperlipidemia: Secondary | ICD-10-CM | POA: Diagnosis not present

## 2024-04-17 DIAGNOSIS — Z7985 Long-term (current) use of injectable non-insulin antidiabetic drugs: Secondary | ICD-10-CM | POA: Diagnosis not present

## 2024-04-17 DIAGNOSIS — R7989 Other specified abnormal findings of blood chemistry: Secondary | ICD-10-CM | POA: Diagnosis not present

## 2024-04-17 DIAGNOSIS — E559 Vitamin D deficiency, unspecified: Secondary | ICD-10-CM

## 2024-04-17 LAB — POCT GLYCOSYLATED HEMOGLOBIN (HGB A1C): Hemoglobin A1C: 8.1 % — AB (ref 4.0–5.6)

## 2024-04-17 MED ORDER — SEMAGLUTIDE (1 MG/DOSE) 4 MG/3ML ~~LOC~~ SOPN: 1.0000 mg | PEN_INJECTOR | SUBCUTANEOUS | 1 refills | Status: DC

## 2024-04-17 NOTE — Progress Notes (Signed)
 Endocrinology Follow Up Note       04/17/2024, 10:48 AM   Subjective:    Patient ID: Derrick Duncan, male    DOB: 09/27/1965.  Derrick Duncan is being seen in follow up after being seen in consultation for management of currently uncontrolled symptomatic diabetes requested by  Marvine Rush, MD.   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 05/05/2017   Diabetes mellitus without complication [facility])    Hypercholesteremia    Hypertension    [facility] spotted fever    Weakness of both legs 11/2016   right arm also    Past Surgical History:  Procedure Laterality Date   COLONOSCOPY N/A 07/04/2019   Procedure: COLONOSCOPY;  Surgeon: Shaaron Lamar CHRISTELLA, MD;  Location: [facility];  Service: Endoscopy;  Laterality: N/A;  2:00   CRANIOTOMY     subdural hematoma    LEFT HEART CATH AND CORONARY ANGIOGRAPHY N/A 09/18/2020   Procedure: LEFT HEART CATH AND CORONARY ANGIOGRAPHY;  Surgeon: Ladona Heinz, MD;  Location: [facility] INVASIVE CV LAB;  Service: Cardiovascular;  Laterality: N/A;   TONSILLECTOMY      Social History   Socioeconomic History   Marital status: Married    Spouse name: Not on file   Number of children: 4   Years of education: Mastersx3   Highest education level: Not on file  Occupational History   Not on file  Tobacco Use   Smoking status: Never   Smokeless tobacco: Former    Types: Engineer, drilling   Vaping status: Never Used  Substance and Sexual Activity   Alcohol use: Not Currently   Drug use: No   Sexual activity: Not on file  Other Topics Concern   Not on file  Social History Narrative   Lives with wife   Left handed    Caffeine use: Drinks coffee daily   Diet soft drinks   Social Drivers of Corporate investment banker Strain: Not on file  Food Insecurity: Not on file  Transportation Needs: Not on file  Physical  Activity: Not on file  Stress: Not on file  Social Connections: Not on file    Family History  Problem Relation Age of Onset   Hypertension Father    Diabetes Father    Hyperlipidemia Father    High blood pressure Sister    Stroke Sister    Kidney disease Sister     Outpatient Encounter Medications as of 04/17/2024  Medication Sig   aspirin  81 MG tablet Take 81 mg by mouth daily.   atorvastatin  (LIPITOR) 40 MG tablet TAKE 1 TABLET BY MOUTH EVERY DAY   Blood Glucose Monitoring Suppl (ACCU-CHEK GUIDE ME) w/Device KIT Use to check glucose daily   carvedilol  (COREG ) 6.25 MG tablet TAKE 1 TABLET BY MOUTH 2 TIMES DAILY WITH A MEAL   cetirizine-pseudoephedrine (ZYRTEC-D) 5-120 MG tablet Take 1 tablet by mouth daily as needed for allergies.   fenofibrate (TRICOR) 48 MG tablet Take 48 mg by mouth daily.   ibuprofen  (ADVIL ) 200 MG tablet Take 800 mg by mouth every 6 (six) hours as needed for  headache or moderate pain.   influenza vac split quadrivalent PF (FLUARIX) 0.5 ML injection Inject into the muscle.   lisinopril  (ZESTRIL ) 40 MG tablet TAKE 1 TABLET BY MOUTH EVERY DAY IN THE EVENING   magnesium oxide (MAG-OX) 400 MG tablet Take 400 mg by mouth daily.   neomycin-polymyxin b-dexamethasone (MAXITROL) 3.5-10000-0.1 SUSP Place 1 drop into the left eye 4 (four) times daily.   nitroGLYCERIN  (NITROSTAT ) 0.4 MG SL tablet Place 1 tablet (0.4 mg total) under the tongue every 5 (five) minutes as needed for up to 25 days for chest pain.   ondansetron  (ZOFRAN -ODT) 4 MG disintegrating tablet Take 1 tablet (4 mg total) by mouth every 8 (eight) hours as needed for nausea or vomiting.   ONETOUCH ULTRA test strip    sildenafil (REVATIO) 20 MG tablet SMARTSIG:1-5 Tablet(s) By Mouth PRN   valACYclovir (VALTREX) 500 MG tablet Take 500 mg by mouth daily as needed (outbreaks).   [DISCONTINUED] Semaglutide , 1 MG/DOSE, 4 MG/3ML SOPN Inject 1 mg as directed once a week.   Semaglutide , 1 MG/DOSE, 4 MG/3ML SOPN Inject  1 mg as directed once a week.   [DISCONTINUED] Cephalexin 500 MG tablet Take 500 mg by mouth 3 (three) times daily. (Patient not taking: Reported on 04/17/2024)   [DISCONTINUED] doxycycline  (VIBRAMYCIN ) 100 MG capsule Take 1 capsule (100 mg total) by mouth 2 (two) times daily. (Patient not taking: Reported on 04/17/2024)   [DISCONTINUED] erythromycin  ophthalmic ointment Place a 1/2 inch ribbon of ointment into the left lower eyelid BID prn. (Patient not taking: Reported on 04/17/2024)   No facility-administered encounter medications on file as of 04/17/2024.    ALLERGIES: Not on File  VACCINATION STATUS: Immunization History  Administered Date(s) Administered   Influenza,inj,Quad PF,6+ Mos 06/22/2022   PFIZER(Purple Top)SARS-COV-2 Vaccination 10/13/2019, 11/05/2019   Tdap 03/04/2012    Diabetes He presents for his follow-up diabetic visit. He has type 2 diabetes mellitus. His disease course has been improving. There are no hypoglycemic associated symptoms. Associated symptoms include fatigue and foot paresthesias. Pertinent negatives for diabetes include no blurred vision, no polydipsia, no polyuria and no weight loss. There are no hypoglycemic complications. Symptoms are improving. There are no diabetic complications. Risk factors for coronary artery disease include diabetes mellitus, dyslipidemia, family history, hypertension and male sex. Current diabetic treatments: Ozempic . He is compliant with treatment most of the time. His weight is increasing steadily. He is following a generally healthy diet. When asked about meal planning, he reported none. He has not had a previous visit with a dietitian. He participates in exercise intermittently. His home blood glucose trend is decreasing steadily. (He presents today, wife was on the phone, with his meter and logs showing at target glycemic profile overall.  His POCT A1c today is 8.1%, improving drastically from last visit of 11.5%.  He denies any  unpleasant side effects from the increased dose of Ozempic .  He notes he is being more mindful about his eating and foods.  He generally feels much better overall.  Analysis of his meter shows 7-day average of 130, 14-day average of 138, 30-day average of 138.) An ACE inhibitor/angiotensin II receptor blocker is being taken. He does not see a podiatrist.Eye exam is current.  Hypertension This is a chronic problem. The current episode started more than 1 year ago. The problem has been waxing and waning since onset. The problem is uncontrolled. Pertinent negatives include no blurred vision. There are no associated agents to hypertension. Risk factors for coronary artery disease  include diabetes mellitus, dyslipidemia, family history and male gender. Past treatments include ACE inhibitors and beta blockers. The current treatment provides moderate improvement. There are no compliance problems.   Hyperlipidemia This is a chronic problem. The current episode started more than 1 year ago. The problem is uncontrolled. Recent lipid tests were reviewed and are variable. Exacerbating diseases include diabetes. Factors aggravating his hyperlipidemia include beta blockers. Current antihyperlipidemic treatment includes statins. The current treatment provides mild improvement of lipids. Compliance problems include adherence to diet and adherence to exercise.  Risk factors for coronary artery disease include diabetes mellitus, dyslipidemia, family history, male sex and hypertension.    Review of systems  Constitutional: + increasing body weight,  current Body mass index is 32.58 kg/m. , no fatigue, no subjective hyperthermia, no subjective hypothermia, Eyes: no blurry vision, no xerophthalmia ENT: no sore throat, no nodules palpated in throat, no dysphagia/odynophagia, no hoarseness Cardiovascular: no chest pain, no shortness of breath, no palpitations, no leg swelling Respiratory: no cough, no shortness of  breath Gastrointestinal: no nausea/vomiting/diarrhea Musculoskeletal: no muscle/joint aches Skin: no rashes, no hyperemia Neurological: no tremors, + mild numbness/ tingling to hands and feet, no dizziness Psychiatric: no depression, no anxiety, + insomnia  Objective:     BP 138/88 (BP Location: Right Arm, Patient Position: Sitting, Cuff Size: Large) Comment: Retake BP.  Pulse 68   Ht 6' (1.829 m)   Wt 240 lb 3.2 oz (109 kg)   BMI 32.58 kg/m   Wt Readings from Last 3 Encounters:  04/17/24 240 lb 3.2 oz (109 kg)  12/22/23 228 lb 6.4 oz (103.6 kg)  04/28/23 239 lb 9.6 oz (108.7 kg)     BP Readings from Last 3 Encounters:  04/17/24 138/88  12/22/23 116/82  04/28/23 135/87      Physical Exam- Limited  Constitutional:  Body mass index is 32.58 kg/m. , not in acute distress, normal state of mind Eyes:  EOMI, no exophthalmos Musculoskeletal: no gross deformities, strength intact in all four extremities, no gross restriction of joint movements Skin:  no rashes, no hyperemia Neurological: no tremor with outstretched hands   Diabetic Foot Exam - Simple   No data filed     CMP ( most recent) CMP     Component Value Date/Time   NA 141 04/06/2024 0928   K 4.1 04/06/2024 0928   CL 106 04/06/2024 0928   CO2 22 04/06/2024 0928   GLUCOSE 129 (H) 04/06/2024 0928   GLUCOSE 107 (H) 11/16/2016 0709   BUN 11 04/06/2024 0928   CREATININE 0.80 04/06/2024 0928   CALCIUM  10.0 04/06/2024 0928   PROT 7.0 04/06/2024 0928   ALBUMIN 4.5 04/06/2024 0928   AST 20 04/06/2024 0928   ALT 23 04/06/2024 0928   ALKPHOS 82 04/06/2024 0928   BILITOT 1.4 (H) 04/06/2024 0928   GFRNONAA 106 07/01/2021 0000   GFRAA 114 09/16/2020 1102     Diabetic Labs (most recent): Lab Results  Component Value Date   HGBA1C 8.1 (A) 04/17/2024   HGBA1C 11.5 (A) 12/22/2023   HGBA1C 9.9 (A) 04/28/2023     Lipid Panel ( most recent) Lipid Panel     Component Value Date/Time   CHOL 192 09/16/2020 1102    TRIG 314 (A) 07/01/2021 0000   HDL 47 09/16/2020 1102   LDLCALC 80 07/01/2021 0000   LDLCALC 120 (H) 09/16/2020 1102   LABVLDL 25 09/16/2020 1102      Lab Results  Component Value Date   TSH 0.419 (  L) 04/06/2024   TSH 0.319 (L) 12/23/2023   TSH 0.37 (A) 12/06/2023   TSH 0.638 09/16/2020   TSH 0.621 11/15/2016   FREET4 1.16 04/06/2024   FREET4 1.18 12/23/2023           Assessment & Plan:   1) Type 2 diabetes mellitus with hyperglycemia, without long-term current use of insulin  (HCC)  He presents today, wife was on the phone, with his meter and logs showing at target glycemic profile overall.  His POCT A1c today is 8.1%, improving drastically from last visit of 11.5%.  He denies any unpleasant side effects from the increased dose of Ozempic .  He notes he is being more mindful about his eating and foods.  He generally feels much better overall.  Analysis of his meter shows 7-day average of 130, 14-day average of 138, 30-day average of 138.  [place]ENWO[place] Derrick CHRISTELLA Debera Duncan has currently uncontrolled symptomatic type 2 DM since 58 years of age.   -Recent labs reviewed.  Calcium  levels have returned to normal.  Bilirubin levels have improved but still slightly above the target range.  He has no history of gallstones.  We did talk about potential of Ozempic  contributing to gallstones, went over s/s of this.  - I had a long discussion with him about the progressive nature of diabetes and the pathology behind its complications. -his diabetes is not currently complicated but he remains at a high risk for more acute and chronic complications which include CAD, CVA, CKD, retinopathy, and neuropathy. These are all discussed in detail with him.  - Nutritional counseling repeated at each appointment due to patients tendency to fall back in to old habits.  - The patient admits there is a room for improvement in their diet and drink choices. -  Suggestion is made for the patient to avoid simple  carbohydrates from their diet including Cakes, Sweet Desserts / Pastries, Ice Cream, Soda (diet and regular), Sweet Tea, Candies, Chips, Cookies, Sweet Pastries, Store Bought Juices, Alcohol in Excess of 1-2 drinks a day, Artificial Sweeteners, Coffee Creamer, and Sugar-free Products. This will help patient to have stable blood glucose profile and potentially avoid unintended weight gain.   - I encouraged the patient to switch to unprocessed or minimally processed complex starch and increased protein intake (animal or plant source), fruits, and vegetables.   - Patient is advised to stick to a routine mealtimes to eat 3 meals a day and avoid unnecessary snacks (to snack only to correct hypoglycemia).  - I have approached him with the following individualized plan to manage his diabetes and patient agrees:   He is advised to continue Ozempic  1 mg SQ weekly.  He is aware to call if glucose is above 200 for 3 days in a week between visits that way we can adjust his regimen accordingly.  -He is encouraged to continue monitoring blood glucose at least twice daily, before breakfast  and before bed, and to call the clinic if he has readings less than 70 or above 200 for 3 tests in a row.   - Specific targets for  A1c; LDL, HDL, and Triglycerides were discussed with the patient.  2) Blood Pressure /Hypertension:  his blood pressure is controlled to target. he is advised to continue his current medications as prescribed by his PCP.  3) Lipids/Hyperlipidemia:    Review of his recent lipid panel from 12/06/23 showed controlled LDL at 91.  he is advised to continue Lipitor 40 mg daily at bedtime.  Side effects and precautions discussed with him.  He is encouraged to stay away from fried foods and butter.    4)  Weight/Diet:  his Body mass index is 32.58 kg/m.  -  clearly complicating his diabetes care.   he is a candidate for moderate weight loss. I discussed with him the fact that loss of 5 - 10% of his   current body weight will have the most impact on his diabetes management.  Exercise, and detailed carbohydrates information provided  -  detailed on discharge instructions.  5) Chronic Care/Health Maintenance: -he is on ACEI/ARB and Statin medications and is encouraged to initiate and continue to follow up with Ophthalmology, Dentist, Podiatrist at least yearly or according to recommendations, and advised to stay away from smoking. I have recommended yearly flu vaccine and pneumonia vaccine at least every 5 years; moderate intensity exercise for up to 150 minutes weekly; and sleep for at least 7 hours a day.  6) Abnormal TSH Recent labs show suppressed TSH, possibly indicating hyperthyroidism.  He does not know of any family history of thyroid  dysfunction.  Has not recently been on steroids, no use of Amiodarone or Lithium in the past.  He does note some insomnia and unexplained weight loss but this was most likely due to uncontrolled diabetes.  His TSH is still slightly suppressed but Free T4 and FT3 were normal indicating subclinical hyperthyroidism.  Thyroid  antibody testing was negative, ruling out autoimmune thyroid  dysfunction.  He does not require treatment at this time.  Will repeat thyroid  function tests on subsequent visits.  7) Hypercalcemia 8) Vitamin D  deficiency.  On recent labs, calcium  level was noted to be high at 11.4.  It has been mildly elevated in the past but not consistent.  He does take a multivitamin with calcium  in it.  He does not use Tums in excess or eat dairy in excess.  He has no known family history of parathyroid problems, pituitary tumors.  He has never had bone density exam in the past.  No history of arrhythmias, kidney stones, osteopenia or osteoporosis, no abdominal pain, no bone pain, no significant mood disorders.    We did talk about the pathophysiology of high calcium  levels in the blood.  His vitamin D  was low, likely the culprit.  I did start him on  Ergocalciferol  50000 units weekly to correct this for 12 weeks, then switching to OTC supplement.  Will repeat on subsequent visits.   - he is advised to maintain close follow up with Marvine Rush, MD for primary care needs, as well as his other providers for optimal and coordinated care.      I spent  46 minutes in the care of the patient today including review of labs from CMP, Lipids, Thyroid  Function, Hematology (current and previous including abstractions from other facilities); face-to-face time discussing  his blood glucose readings/logs, discussing hypoglycemia and hyperglycemia episodes and symptoms, medications doses, his options of short and long term treatment based on the latest standards of care / guidelines;  discussion about incorporating lifestyle medicine;  and documenting the encounter. Risk reduction counseling performed per USPSTF guidelines to reduce obesity and cardiovascular risk factors.     Please refer to Patient Instructions for Blood Glucose Monitoring and Insulin /Medications Dosing Guide  in media tab for additional information. Please  also refer to  Patient Self Inventory in the Media  tab for reviewed elements of pertinent patient history.  Derrick Duncan participated in the discussions, expressed  understanding, and voiced agreement with the above plans.  All questions were answered to his satisfaction. he is encouraged to contact clinic should he have any questions or concerns prior to his return visit.     Follow up plan: - Return in about 3 months (around 07/17/2024) for Diabetes F/U with A1c in office, No previsit labs, Bring meter and logs.   Benton Rio, [facility] [address] [place], [place] [address] Phone: [phone] Fax: [phone]  04/17/2024, 10:48 AM

## 2024-07-17 ENCOUNTER — Ambulatory Visit: Admitting: Nurse Practitioner

## 2024-07-17 ENCOUNTER — Encounter: Payer: Self-pay | Admitting: Nurse Practitioner

## 2024-07-17 VITALS — BP 124/90 | HR 72 | Ht 72.0 in | Wt 239.4 lb

## 2024-07-17 DIAGNOSIS — Z7985 Long-term (current) use of injectable non-insulin antidiabetic drugs: Secondary | ICD-10-CM

## 2024-07-17 DIAGNOSIS — E1165 Type 2 diabetes mellitus with hyperglycemia: Secondary | ICD-10-CM

## 2024-07-17 DIAGNOSIS — I10 Essential (primary) hypertension: Secondary | ICD-10-CM

## 2024-07-17 DIAGNOSIS — E559 Vitamin D deficiency, unspecified: Secondary | ICD-10-CM

## 2024-07-17 DIAGNOSIS — E782 Mixed hyperlipidemia: Secondary | ICD-10-CM | POA: Diagnosis not present

## 2024-07-17 LAB — POCT GLYCOSYLATED HEMOGLOBIN (HGB A1C): Hemoglobin A1C: 9.1 % — AB (ref 4.0–5.6)

## 2024-07-17 MED ORDER — SEMAGLUTIDE (2 MG/DOSE) 8 MG/3ML ~~LOC~~ SOPN: 2.0000 mg | PEN_INJECTOR | SUBCUTANEOUS | 3 refills | Status: DC

## 2024-07-17 NOTE — Progress Notes (Signed)
 Endocrinology Follow Up Note       07/17/2024, 3:31 PM   Subjective:    Patient ID: Derrick Duncan, male    DOB: 24-May-1966.  Derrick Duncan is being seen in follow up after being seen in consultation for management of currently uncontrolled symptomatic diabetes requested by  Derrick Cassondra BROCKS, MD.   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 05/05/2017   Diabetes mellitus without complication [facility])    Hypercholesteremia    Hypertension    [facility] spotted fever    Weakness of both legs 11/2016   right arm also    Past Surgical History:  Procedure Laterality Date   COLONOSCOPY N/A 07/04/2019   Procedure: COLONOSCOPY;  Surgeon: Shaaron Lamar CHRISTELLA, MD;  Location: [facility];  Service: Endoscopy;  Laterality: N/A;  2:00   CRANIOTOMY     subdural hematoma    LEFT HEART CATH AND CORONARY ANGIOGRAPHY N/A 09/18/2020   Procedure: LEFT HEART CATH AND CORONARY ANGIOGRAPHY;  Surgeon: Ladona Heinz, MD;  Location: [facility] INVASIVE CV LAB;  Service: Cardiovascular;  Laterality: N/A;   TONSILLECTOMY      Social History   Socioeconomic History   Marital status: Married    Spouse name: Not on file   Number of children: 4   Years of education: Mastersx3   Highest education level: Not on file  Occupational History   Not on file  Tobacco Use   Smoking status: Never   Smokeless tobacco: Former    Types: Engineer, Drilling   Vaping status: Never Used  Substance and Sexual Activity   Alcohol use: Not Currently   Drug use: No   Sexual activity: Not on file  Other Topics Concern   Not on file  Social History Narrative   Lives with wife   Left handed    Caffeine use: Drinks coffee daily   Diet soft drinks   Social Drivers of Corporate Investment Banker Strain: Low Risk  (07/10/2024)   Received from [facility]   Overall  Financial Resource Strain (CARDIA)    Difficulty of Paying Living Expenses: Not hard at all  Food Insecurity: No Food Insecurity (07/10/2024)   Received from [facility]   Hunger Vital Sign    Within the past 12 months, you worried that your food would run out before you got the money to buy more.: Never true    Within the past 12 months, the food you bought just didn't last and you didn't have money to get more.: Never true  Transportation Needs: No Transportation Needs (07/10/2024)   Received from [facility] - Transportation    In the past 12 months, has lack of transportation kept you from medical appointments or from getting medications?: No    Lack of Transportation (Non-Medical): No  Physical Activity: Not on file  Stress: Not on file  Social Connections: Not on file    Family History  Problem Relation Age of Onset   Hypertension Father    Diabetes Father    Hyperlipidemia Father  High blood pressure Sister    Stroke Sister    Kidney disease Sister     Outpatient Encounter Medications as of 07/17/2024  Medication Sig   aspirin  81 MG tablet Take 81 mg by mouth daily.   atorvastatin  (LIPITOR) 40 MG tablet TAKE 1 TABLET BY MOUTH EVERY DAY   Blood Glucose Monitoring Suppl (ACCU-CHEK GUIDE ME) w/Device KIT Use to check glucose daily   carvedilol  (COREG ) 6.25 MG tablet TAKE 1 TABLET BY MOUTH 2 TIMES DAILY WITH A MEAL   cetirizine-pseudoephedrine (ZYRTEC-D) 5-120 MG tablet Take 1 tablet by mouth daily as needed for allergies.   fenofibrate (TRICOR) 48 MG tablet Take 48 mg by mouth daily.   hydrochlorothiazide (HYDRODIURIL) 25 MG tablet Take 25 mg by mouth daily.   ibuprofen  (ADVIL ) 200 MG tablet Take 800 mg by mouth every 6 (six) hours as needed for headache or moderate pain.   influenza vac split quadrivalent PF (FLUARIX) 0.5 ML injection Inject into the muscle.   lisinopril  (ZESTRIL ) 40 MG tablet TAKE 1 TABLET BY MOUTH EVERY DAY  IN THE EVENING   magnesium oxide (MAG-OX) 400 MG tablet Take 400 mg by mouth daily.   neomycin-polymyxin b-dexamethasone (MAXITROL) 3.5-10000-0.1 SUSP Place 1 drop into the left eye 4 (four) times daily.   nitroGLYCERIN  (NITROSTAT ) 0.4 MG SL tablet Place 1 tablet (0.4 mg total) under the tongue every 5 (five) minutes as needed for up to 25 days for chest pain.   ondansetron  (ZOFRAN -ODT) 4 MG disintegrating tablet Take 1 tablet (4 mg total) by mouth every 8 (eight) hours as needed for nausea or vomiting.   ONETOUCH ULTRA test strip    Semaglutide , 2 MG/DOSE, 8 MG/3ML SOPN Inject 2 mg as directed once a week.   sildenafil (REVATIO) 20 MG tablet SMARTSIG:1-5 Tablet(s) By Mouth PRN   valACYclovir (VALTREX) 500 MG tablet Take 500 mg by mouth daily as needed (outbreaks).   [DISCONTINUED] Semaglutide , 1 MG/DOSE, 4 MG/3ML SOPN Inject 1 mg as directed once a week.   No facility-administered encounter medications on file as of 07/17/2024.    ALLERGIES: No Known Allergies  VACCINATION STATUS: Immunization History  Administered Date(s) Administered   Influenza,inj,Quad PF,6+ Mos 06/22/2022   PFIZER(Purple Top)SARS-COV-2 Vaccination 10/13/2019, 11/05/2019   Tdap 03/04/2012    Diabetes He presents for his follow-up diabetic visit. He has type 2 diabetes mellitus. His disease course has been worsening. There are no hypoglycemic associated symptoms. Associated symptoms include fatigue and foot paresthesias. Pertinent negatives for diabetes include no polydipsia, no polyuria and no weight loss. There are no hypoglycemic complications. Symptoms are improving. There are no diabetic complications. Risk factors for coronary artery disease include diabetes mellitus, dyslipidemia, family history, hypertension and male sex. Current diabetic treatments: Ozempic . He is compliant with treatment most of the time. His weight is fluctuating minimally. He is following a generally healthy diet. When asked about meal  planning, he reported none. He has not had a previous visit with a dietitian. He participates in exercise intermittently. His home blood glucose trend is fluctuating minimally. (He presents today, accompanied by his wife, with no meter or logs (accidentally forgot them at home).  He notes his recent readings have been 180-200 range.  He admits he has not done well with diet recently, still craving sweets.  He denies any s/s of hypoglycemia.  His POCT A1c today is 9.1%, increasing from last visit of 8.1%.) An ACE inhibitor/angiotensin II receptor blocker is being taken. He does not see a podiatrist.Eye exam  is current.    Review of systems  Constitutional: minimally fluctuating body weight,  current Body mass index is 32.47 kg/m. , no fatigue, no subjective hyperthermia, no subjective hypothermia, Eyes: no blurry vision, no xerophthalmia ENT: no sore throat, no nodules palpated in throat, no dysphagia/odynophagia, no hoarseness Cardiovascular: no chest pain, no shortness of breath, no palpitations, no leg swelling Respiratory: no cough, no shortness of breath Gastrointestinal: no nausea/vomiting/diarrhea Musculoskeletal: no muscle/joint aches Skin: no rashes, no hyperemia Neurological: no tremors, + mild numbness/ tingling to hands and feet, no dizziness Psychiatric: no depression, no anxiety, + insomnia  Objective:     BP (!) 124/90 (BP Location: Right Arm, Patient Position: Sitting, Cuff Size: Large) Comment: Patient just had HCTZ added to his medications, he has oinly 2 doses.  Pulse 72   Ht 6' (1.829 m)   Wt 239 lb 6.4 oz (108.6 kg)   BMI 32.47 kg/m   Wt Readings from Last 3 Encounters:  07/17/24 239 lb 6.4 oz (108.6 kg)  04/17/24 240 lb 3.2 oz (109 kg)  12/22/23 228 lb 6.4 oz (103.6 kg)     BP Readings from Last 3 Encounters:  07/17/24 (!) 124/90  04/17/24 138/88  12/22/23 116/82     Physical Exam- Limited  Constitutional:  Body mass index is 32.47 kg/m. , not in acute  distress, normal state of mind Eyes:  EOMI, no exophthalmos Musculoskeletal: no gross deformities, strength intact in all four extremities, no gross restriction of joint movements Skin:  no rashes, no hyperemia Neurological: no tremor with outstretched hands   Diabetic Foot Exam - Simple   Simple Foot Form Visual Inspection No deformities, no ulcerations, no other skin breakdown bilaterally: Yes Sensation Testing Intact to touch and monofilament testing bilaterally: Yes Pulse Check Posterior Tibialis and Dorsalis pulse intact bilaterally: Yes Comments     CMP ( most recent) CMP     Component Value Date/Time   NA 141 04/06/2024 0928   K 4.1 04/06/2024 0928   CL 106 04/06/2024 0928   CO2 22 04/06/2024 0928   GLUCOSE 129 (H) 04/06/2024 0928   GLUCOSE 107 (H) 11/16/2016 0709   BUN 11 04/06/2024 0928   CREATININE 0.80 04/06/2024 0928   CALCIUM  10.0 04/06/2024 0928   PROT 7.0 04/06/2024 0928   ALBUMIN 4.5 04/06/2024 0928   AST 20 04/06/2024 0928   ALT 23 04/06/2024 0928   ALKPHOS 82 04/06/2024 0928   BILITOT 1.4 (H) 04/06/2024 0928   GFRNONAA 106 07/01/2021 0000   GFRAA 114 09/16/2020 1102     Diabetic Labs (most recent): Lab Results  Component Value Date   HGBA1C 9.1 (A) 07/17/2024   HGBA1C 8.1 (A) 04/17/2024   HGBA1C 11.5 (A) 12/22/2023     Lipid Panel ( most recent) Lipid Panel     Component Value Date/Time   CHOL 192 09/16/2020 1102   TRIG 314 (A) 07/01/2021 0000   HDL 47 09/16/2020 1102   LDLCALC 80 07/01/2021 0000   LDLCALC 120 (H) 09/16/2020 1102   LABVLDL 25 09/16/2020 1102      Lab Results  Component Value Date   TSH 0.419 (L) 04/06/2024   TSH 0.319 (L) 12/23/2023   TSH 0.37 (A) 12/06/2023   TSH 0.638 09/16/2020   TSH 0.621 11/15/2016   FREET4 1.16 04/06/2024   FREET4 1.18 12/23/2023           Assessment & Plan:   1) Type 2 diabetes mellitus with hyperglycemia, without long-term current use of insulin  (  HCC)  He presents today,  accompanied by his wife, with no meter or logs (accidentally forgot them at home).  He notes his recent readings have been 180-200 range.  He admits he has not done well with diet recently, still craving sweets.  He denies any s/s of hypoglycemia.  His POCT A1c today is 9.1%, increasing from last visit of 8.1%.  [place] Derrick CHRISTELLA Debera Duncan has currently uncontrolled symptomatic type 2 DM since 58 years of age.   -Recent labs reviewed.    - I had a long discussion with him about the progressive nature of diabetes and the pathology behind its complications. -his diabetes is not currently complicated but he remains at a high risk for more acute and chronic complications which include CAD, CVA, CKD, retinopathy, and neuropathy. These are all discussed in detail with him.  - Nutritional counseling repeated/built upon at each appointment.  - The patient admits there is a room for improvement in their diet and drink choices. -  Suggestion is made for the patient to avoid simple carbohydrates from their diet including Cakes, Sweet Desserts / Pastries, Ice Cream, Soda (diet and regular), Sweet Tea, Candies, Chips, Cookies, Sweet Pastries, Store Bought Juices, Alcohol in Excess of 1-2 drinks a day, Artificial Sweeteners, Coffee Creamer, and Sugar-free Products. This will help patient to have stable blood glucose profile and potentially avoid unintended weight gain.   - I encouraged the patient to switch to unprocessed or minimally processed complex starch and increased protein intake (animal or plant source), fruits, and vegetables.   - Patient is advised to stick to a routine mealtimes to eat 3 meals a day and avoid unnecessary snacks (to snack only to correct hypoglycemia).  - I have approached him with the following individualized plan to manage his diabetes and patient agrees:   Will benefit from increase in his Ozempic  to 2 mg SQ weekly.  He is aware to call if glucose is above 200 for 3 days in a week  between visits that way we can adjust his regimen accordingly.  -He is encouraged to continue monitoring blood glucose at least twice daily, before breakfast  and before bed, and to call the clinic if he has readings less than 70 or above 200 for 3 tests in a row.   - Specific targets for  A1c; LDL, HDL, and Triglycerides were discussed with the patient.  2) Blood Pressure /Hypertension:  his blood pressure is controlled to target- diastolic number is slightly elevated today- notes his PCP recently added him on HCTZ. he is advised to continue his current medications as prescribed by his PCP.  3) Lipids/Hyperlipidemia:    Review of his recent lipid panel from 12/06/23 showed controlled LDL at 91.  he is advised to continue Lipitor 40 mg daily at bedtime.  Side effects and precautions discussed with him.  He is encouraged to stay away from fried foods and butter.    4)  Weight/Diet:  his Body mass index is 32.47 kg/m.  -  clearly complicating his diabetes care.   he is a candidate for moderate weight loss. I discussed with him the fact that loss of 5 - 10% of his  current body weight will have the most impact on his diabetes management.  Exercise, and detailed carbohydrates information provided  -  detailed on discharge instructions.  5) Chronic Care/Health Maintenance: -he is on ACEI/ARB and Statin medications and is encouraged to initiate and continue to follow up with Ophthalmology, Dentist,  Podiatrist at least yearly or according to recommendations, and advised to stay away from smoking. I have recommended yearly flu vaccine and pneumonia vaccine at least every 5 years; moderate intensity exercise for up to 150 minutes weekly; and sleep for at least 7 hours a day.  6) Abnormal TSH Recent labs show suppressed TSH, possibly indicating hyperthyroidism.  He does not know of any family history of thyroid  dysfunction.  Has not recently been on steroids, no use of Amiodarone or Lithium in the past.  He  does note some insomnia and unexplained weight loss but this was most likely due to uncontrolled diabetes.  His TSH is still slightly suppressed but Free T4 and FT3 were normal indicating subclinical hyperthyroidism.  Thyroid  antibody testing was negative, ruling out autoimmune thyroid  dysfunction.  He does not require treatment at this time.  Will repeat thyroid  function tests prior to next visit for surveillance.  7) Hypercalcemia 8) Vitamin D  deficiency. On recent labs, calcium  level was noted to be high at 11.4.  It has been mildly elevated in the past but not consistent.  He does take a multivitamin with calcium  in it.  He does not use Tums in excess or eat dairy in excess.  He has no known family history of parathyroid problems, pituitary tumors.  He has never had bone density exam in the past.  No history of arrhythmias, kidney stones, osteopenia or osteoporosis, no abdominal pain, no bone pain, no significant mood disorders.    We did talk about the pathophysiology of high calcium  levels in the blood.  His vitamin D  was low, likely the culprit.  I did start him on Ergocalciferol  50000 units weekly to correct this for 12 weeks, then switching to OTC supplement.  Will repeat vitamin D  level prior to next visit.   - he is advised to maintain close follow up with Hodges, Jonathon C, MD for primary care needs, as well as his other providers for optimal and coordinated care.    I spent  44  minutes in the care of the patient today including review of labs from CMP, Lipids, Thyroid  Function, Hematology (current and previous including abstractions from other facilities); face-to-face time discussing  his blood glucose readings/logs, discussing hypoglycemia and hyperglycemia episodes and symptoms, medications doses, his options of short and long term treatment based on the latest standards of care / guidelines;  discussion about incorporating lifestyle medicine;  and documenting the encounter. Risk  reduction counseling performed per USPSTF guidelines to reduce obesity and cardiovascular risk factors.     Please refer to Patient Instructions for Blood Glucose Monitoring and Insulin /Medications Dosing Guide  in media tab for additional information. Please  also refer to  Patient Self Inventory in the Media  tab for reviewed elements of pertinent patient history.  Derrick Duncan participated in the discussions, expressed understanding, and voiced agreement with the above plans.  All questions were answered to his satisfaction. he is encouraged to contact clinic should he have any questions or concerns prior to his return visit.     Follow up plan: - Return in about 3 months (around 10/15/2024) for Diabetes F/U with A1c in office, Previsit labs, Bring meter and logs.   Benton Rio, [facility] [address] [place], [place] [address] Phone: [phone] Fax: [phone]  07/17/2024, 3:31 PM

## 2024-07-19 ENCOUNTER — Other Ambulatory Visit: Payer: Self-pay | Admitting: Nurse Practitioner

## 2024-07-19 ENCOUNTER — Other Ambulatory Visit (HOSPITAL_COMMUNITY): Payer: Self-pay

## 2024-07-19 ENCOUNTER — Telehealth: Payer: Self-pay

## 2024-07-19 NOTE — Telephone Encounter (Signed)
 Pharmacy Patient Advocate Encounter   Received notification from Pt Calls Messages that prior authorization for Ozempic  (2mg /DOSE) 8mg /61ml is required/requested.   Insurance verification completed.   The patient is insured through [facility].   Per test claim: CONCURRENT USE/DRUG OVERLAP IDENTIFIED  It looks like they are saying it is too soon to refill because he last picked up a 90 day fill of the lower dose on 06/10/24. Next fill date would be 09/02/2024, but pharmacy should be able to do an override for a dose change so he wouldn't have to wait.

## 2024-07-19 NOTE — Telephone Encounter (Signed)
 He needs better glycemic control, this is why I increased him.  Lets forward to PA team to see what we need to do in this case.

## 2024-07-20 NOTE — Telephone Encounter (Signed)
 How to we initiate the override?  I have never done that.  Do we just call the pharmacy?

## 2024-07-23 NOTE — Telephone Encounter (Signed)
 I guess we need to call and try to override.  Even if he double injects the 1 mg, they wouldn't likely fill until the full supply is nearly out.

## 2024-07-25 NOTE — Telephone Encounter (Signed)
 I have called patient's insurance and ask for override. They are working on this and will send us  a fax.

## 2024-07-30 NOTE — Telephone Encounter (Signed)
 Patient's wife is calling back to check the status of this

## 2024-07-30 NOTE — Telephone Encounter (Signed)
 Spoke with pt's wife making her aware we have not received a determination from the insurance company. Understanding voiced.

## 2024-07-30 NOTE — Telephone Encounter (Signed)
 Left a message requesting pt to return call to the office

## 2024-07-30 NOTE — Telephone Encounter (Signed)
Please call pt's wife back.  °

## 2024-08-02 NOTE — Telephone Encounter (Signed)
 I contacted the patients insurance provider, [employer], to check the status of the override. I spoke with Landon HERO from the [employer] team, who informed me that there is no override currently in process and that a new prior authorization (PA) is required for the medication to be approved. She mentioned that she consulted with her senior leadership, who confirmed this decision, as there is no record of any prior action regarding this medication. Please advise if we should proceed with submitting a new PA as requested by the insurance.

## 2024-08-03 ENCOUNTER — Other Ambulatory Visit (HOSPITAL_COMMUNITY): Payer: Self-pay

## 2024-08-03 NOTE — Telephone Encounter (Signed)
 Pharmacy Patient Advocate Encounter    PA required; PA started via CoverMyMeds. KEY [id number] . Waiting for clinical questions to populate.

## 2024-08-17 ENCOUNTER — Other Ambulatory Visit (HOSPITAL_COMMUNITY): Payer: Self-pay

## 2024-08-17 NOTE — Telephone Encounter (Signed)
 Pharmacy Patient Advocate Encounter  Received notification from [facility] that Prior Authorization for Ozempic  has been CANCELLED due to no additional PA is required.  Test claim is still showing refill too soon. I have been informed that not all insurances will accept an override. It appears that is the case here. Since insurance will not accept the override or a PA, unfortunately he will have to wait until the next fill to pick up the new strength. Next fill can be processed on 09/02/2024

## 2024-08-20 ENCOUNTER — Telehealth: Payer: Self-pay | Admitting: *Deleted

## 2024-08-20 DIAGNOSIS — E1165 Type 2 diabetes mellitus with hyperglycemia: Secondary | ICD-10-CM

## 2024-08-20 MED ORDER — SEMAGLUTIDE (2 MG/DOSE) 8 MG/3ML ~~LOC~~ SOPN: 2.0000 mg | PEN_INJECTOR | SUBCUTANEOUS | 3 refills | Status: AC

## 2024-08-20 NOTE — Telephone Encounter (Signed)
"   [facility] [place] They are getting ready Ozempic  2 mg a 3 month supply for the patient. He should be able to get this today. It will be $ 149.99 per pharmacist  for the 3 month apply.  I called and shared with Amy, the patient's wife. "

## 2024-10-25 ENCOUNTER — Ambulatory Visit: Admitting: Nurse Practitioner
